# Patient Record
Sex: Female | Born: 1935 | Race: White | Hispanic: No | Smoking: Former smoker
Health system: Southern US, Community
[De-identification: ages and names within clinical notes are randomized; demographics above are authoritative.]

## PROBLEM LIST (undated history)

## (undated) DIAGNOSIS — M1990 Unspecified osteoarthritis, unspecified site: Secondary | ICD-10-CM

## (undated) DIAGNOSIS — E785 Hyperlipidemia, unspecified: Secondary | ICD-10-CM

## (undated) DIAGNOSIS — N390 Urinary tract infection, site not specified: Secondary | ICD-10-CM

## (undated) DIAGNOSIS — W19XXXA Unspecified fall, initial encounter: Secondary | ICD-10-CM

## (undated) DIAGNOSIS — E039 Hypothyroidism, unspecified: Secondary | ICD-10-CM

## (undated) DIAGNOSIS — F0390 Unspecified dementia without behavioral disturbance: Secondary | ICD-10-CM

## (undated) DIAGNOSIS — M4800 Spinal stenosis, site unspecified: Secondary | ICD-10-CM

## (undated) DIAGNOSIS — H409 Unspecified glaucoma: Secondary | ICD-10-CM

## (undated) DIAGNOSIS — M810 Age-related osteoporosis without current pathological fracture: Secondary | ICD-10-CM

---

## 2006-06-10 ENCOUNTER — Encounter: Payer: Self-pay | Admitting: Psychiatry

## 2006-06-25 ENCOUNTER — Encounter: Payer: Self-pay | Admitting: Psychiatry

## 2006-07-26 ENCOUNTER — Encounter: Payer: Self-pay | Admitting: Psychiatry

## 2006-08-18 ENCOUNTER — Ambulatory Visit: Payer: Self-pay | Admitting: Anesthesiology

## 2006-09-08 ENCOUNTER — Ambulatory Visit: Payer: Self-pay | Admitting: Anesthesiology

## 2006-10-15 ENCOUNTER — Ambulatory Visit: Payer: Self-pay | Admitting: Anesthesiology

## 2006-11-18 ENCOUNTER — Ambulatory Visit: Payer: Self-pay | Admitting: Anesthesiology

## 2007-01-19 ENCOUNTER — Ambulatory Visit: Payer: Self-pay | Admitting: Anesthesiology

## 2007-03-25 ENCOUNTER — Ambulatory Visit: Payer: Self-pay | Admitting: Anesthesiology

## 2007-04-15 ENCOUNTER — Ambulatory Visit: Payer: Self-pay | Admitting: Anesthesiology

## 2007-05-14 ENCOUNTER — Ambulatory Visit: Payer: Self-pay | Admitting: Anesthesiology

## 2007-10-06 ENCOUNTER — Ambulatory Visit: Payer: Self-pay | Admitting: Anesthesiology

## 2007-12-08 ENCOUNTER — Ambulatory Visit: Payer: Self-pay | Admitting: Anesthesiology

## 2008-02-08 ENCOUNTER — Ambulatory Visit: Payer: Self-pay | Admitting: Anesthesiology

## 2008-04-21 ENCOUNTER — Ambulatory Visit: Payer: Self-pay | Admitting: Anesthesiology

## 2008-09-05 ENCOUNTER — Ambulatory Visit: Payer: Self-pay | Admitting: Anesthesiology

## 2008-11-17 ENCOUNTER — Ambulatory Visit: Payer: Self-pay | Admitting: Anesthesiology

## 2011-03-19 ENCOUNTER — Ambulatory Visit: Payer: Self-pay | Admitting: Endocrinology

## 2011-10-30 ENCOUNTER — Observation Stay: Payer: Self-pay | Admitting: Specialist

## 2011-10-30 LAB — CBC WITH DIFFERENTIAL/PLATELET
Basophil %: 0.6 %
Eosinophil #: 0 10*3/uL (ref 0.0–0.7)
Eosinophil %: 0.2 %
HGB: 11 g/dL — ABNORMAL LOW (ref 12.0–16.0)
Lymphocyte #: 0.8 10*3/uL — ABNORMAL LOW (ref 1.0–3.6)
Lymphocyte %: 14.9 %
MCHC: 33.4 g/dL (ref 32.0–36.0)
MCV: 89 fL (ref 80–100)
Monocyte %: 7.1 %
Neutrophil #: 4 10*3/uL (ref 1.4–6.5)
Neutrophil %: 77.2 %
RBC: 3.69 10*6/uL — ABNORMAL LOW (ref 3.80–5.20)
RDW: 15.4 % — ABNORMAL HIGH (ref 11.5–14.5)
WBC: 5.2 10*3/uL (ref 3.6–11.0)

## 2011-10-30 LAB — BASIC METABOLIC PANEL
Anion Gap: 7 (ref 7–16)
Calcium, Total: 8.9 mg/dL (ref 8.5–10.1)
Co2: 26 mmol/L (ref 21–32)
EGFR (African American): 55 — ABNORMAL LOW
Glucose: 90 mg/dL (ref 65–99)
Osmolality: 286 (ref 275–301)
Sodium: 142 mmol/L (ref 136–145)

## 2011-10-30 LAB — TROPONIN I: Troponin-I: <0.02 ng/mL

## 2011-10-30 LAB — URINALYSIS, COMPLETE
Bacteria: NONE SEEN
Bilirubin,UR: NEGATIVE
Glucose,UR: NEGATIVE mg/dL (ref 0–75)
Hyaline Cast: 10
Nitrite: NEGATIVE
Protein: NEGATIVE
WBC UR: 35 /HPF (ref 0–5)

## 2011-10-30 LAB — PROTIME-INR
INR: 1
Prothrombin Time: 13.2 secs (ref 11.5–14.7)

## 2011-10-30 LAB — CK TOTAL AND CKMB (NOT AT ARMC)
CK, Total: 265 U/L — ABNORMAL HIGH (ref 21–215)
CK-MB: 7.9 ng/mL — ABNORMAL HIGH (ref 0.5–3.6)

## 2011-10-31 LAB — CBC WITH DIFFERENTIAL/PLATELET
Basophil #: 0 10*3/uL (ref 0.0–0.1)
Basophil %: 0.9 %
Eosinophil #: 0.1 10*3/uL (ref 0.0–0.7)
Eosinophil %: 1.3 %
HGB: 9.5 g/dL — ABNORMAL LOW (ref 12.0–16.0)
Lymphocyte #: 0.9 10*3/uL — ABNORMAL LOW (ref 1.0–3.6)
MCH: 30.1 pg (ref 26.0–34.0)
MCHC: 33.9 g/dL (ref 32.0–36.0)
MCV: 89 fL (ref 80–100)
Monocyte #: 0.3 x10 3/mm (ref 0.2–0.9)
Neutrophil #: 2.8 10*3/uL (ref 1.4–6.5)
Neutrophil %: 68.2 %
RBC: 3.17 10*6/uL — ABNORMAL LOW (ref 3.80–5.20)
WBC: 4.2 10*3/uL (ref 3.6–11.0)

## 2011-10-31 LAB — CK: CK, Total: 250 U/L — ABNORMAL HIGH (ref 21–215)

## 2011-10-31 LAB — BASIC METABOLIC PANEL
Anion Gap: 7 (ref 7–16)
Calcium, Total: 8.1 mg/dL — ABNORMAL LOW (ref 8.5–10.1)
Co2: 25 mmol/L (ref 21–32)
Creatinine: 0.81 mg/dL (ref 0.60–1.30)
EGFR (African American): >60
Osmolality: 283 (ref 275–301)
Potassium: 3.3 mmol/L — ABNORMAL LOW (ref 3.5–5.1)

## 2011-10-31 LAB — CK-MB: CK-MB: 6.6 ng/mL — ABNORMAL HIGH (ref 0.5–3.6)

## 2011-11-01 LAB — URINE CULTURE

## 2012-07-31 ENCOUNTER — Emergency Department: Payer: Self-pay | Admitting: Emergency Medicine

## 2012-08-03 ENCOUNTER — Emergency Department: Payer: Self-pay | Admitting: Emergency Medicine

## 2013-09-02 ENCOUNTER — Emergency Department: Payer: Self-pay | Admitting: Emergency Medicine

## 2013-09-02 LAB — CBC
HCT: 34 % — AB (ref 35.0–47.0)
HGB: 11.2 g/dL — ABNORMAL LOW (ref 12.0–16.0)
MCH: 30.9 pg (ref 26.0–34.0)
MCHC: 33.1 g/dL (ref 32.0–36.0)
MCV: 93 fL (ref 80–100)
Platelet: 222 10*3/uL (ref 150–440)
RBC: 3.64 10*6/uL — ABNORMAL LOW (ref 3.80–5.20)
RDW: 16.2 % — ABNORMAL HIGH (ref 11.5–14.5)
WBC: 5.9 10*3/uL (ref 3.6–11.0)

## 2013-09-02 LAB — COMPREHENSIVE METABOLIC PANEL WITH GFR
Albumin: 3.3 g/dL — ABNORMAL LOW
Alkaline Phosphatase: 95 U/L
Anion Gap: 7
BUN: 40 mg/dL — ABNORMAL HIGH
Bilirubin,Total: 0.4 mg/dL
Calcium, Total: 8.8 mg/dL
Chloride: 107 mmol/L
Co2: 25 mmol/L
Creatinine: 1.56 mg/dL — ABNORMAL HIGH
EGFR (African American): 33 — ABNORMAL LOW
EGFR (Non-African Amer.): 28 — ABNORMAL LOW
Glucose: 129 mg/dL — ABNORMAL HIGH
Osmolality: 289
Potassium: 4.5 mmol/L
SGOT(AST): 44 U/L — ABNORMAL HIGH
SGPT (ALT): 32 U/L
Sodium: 139 mmol/L
Total Protein: 6.9 g/dL

## 2013-09-02 LAB — TROPONIN I

## 2013-09-03 LAB — URINALYSIS, COMPLETE
Bilirubin,UR: NEGATIVE
Glucose,UR: NEGATIVE mg/dL (ref 0–75)
Ketone: NEGATIVE
Nitrite: POSITIVE
PH: 5 (ref 4.5–8.0)
Protein: NEGATIVE
Specific Gravity: 1.015 (ref 1.003–1.030)
Squamous Epithelial: NONE SEEN

## 2013-09-14 ENCOUNTER — Emergency Department: Payer: Self-pay | Admitting: Emergency Medicine

## 2013-09-14 LAB — BASIC METABOLIC PANEL
Anion Gap: 6 — ABNORMAL LOW (ref 7–16)
BUN: 30 mg/dL — ABNORMAL HIGH (ref 7–18)
Calcium, Total: 8.6 mg/dL (ref 8.5–10.1)
Chloride: 111 mmol/L — ABNORMAL HIGH (ref 98–107)
Co2: 25 mmol/L (ref 21–32)
Creatinine: 1.26 mg/dL (ref 0.60–1.30)
EGFR (Non-African Amer.): 36 — ABNORMAL LOW
GFR CALC AF AMER: 42 — AB
Glucose: 80 mg/dL (ref 65–99)
Osmolality: 288 (ref 275–301)
Potassium: 4.1 mmol/L (ref 3.5–5.1)
Sodium: 142 mmol/L (ref 136–145)

## 2013-09-14 LAB — URINALYSIS, COMPLETE
Bacteria: NONE SEEN
Bilirubin,UR: NEGATIVE
Blood: NEGATIVE
GLUCOSE, UR: NEGATIVE mg/dL (ref 0–75)
Ketone: NEGATIVE
Nitrite: NEGATIVE
Ph: 5 (ref 4.5–8.0)
Protein: NEGATIVE
RBC,UR: NONE SEEN /HPF (ref 0–5)
Specific Gravity: 1.014 (ref 1.003–1.030)
Squamous Epithelial: NONE SEEN
WBC UR: NONE SEEN /HPF (ref 0–5)

## 2013-09-14 LAB — CBC
HCT: 33.9 % — ABNORMAL LOW (ref 35.0–47.0)
HGB: 10.9 g/dL — ABNORMAL LOW (ref 12.0–16.0)
MCH: 30.3 pg (ref 26.0–34.0)
MCHC: 32.2 g/dL (ref 32.0–36.0)
MCV: 94 fL (ref 80–100)
Platelet: 253 10*3/uL (ref 150–440)
RBC: 3.6 10*6/uL — ABNORMAL LOW (ref 3.80–5.20)
RDW: 16 % — ABNORMAL HIGH (ref 11.5–14.5)
WBC: 5.1 10*3/uL (ref 3.6–11.0)

## 2014-02-23 ENCOUNTER — Emergency Department: Payer: Self-pay | Admitting: Emergency Medicine

## 2014-06-07 ENCOUNTER — Emergency Department
Admit: 2014-06-07 | Disposition: A | Discharge status: Other Acute Inpatient Hospital | Payer: Self-pay | Admitting: Emergency Medicine

## 2014-06-13 NOTE — Discharge Summary (Signed)
PATIENT NAME:  Carrie, Espinoza MR#:  [id number] DATE OF BIRTH:  14-Nov-1917  DATE OF ADMISSION:  10/30/2011 DATE OF DISCHARGE:  11/03/2011  For a detailed note, please take a look at the history and physical done on admission by Dr. Auburn Bilberry.    DIAGNOSES AT DISCHARGE:  1. Altered mental status/confusion secondary to urinary tract infection.  2. Status post mechanical fall.  3. Urinary tract infection.  4. Hypothyroidism.  5. Hypertension.  6. Hypokalemia.   DIET: The patient is being discharged on a low sodium, low fat diet.   ACTIVITY: As tolerated.   DISPOSITION: The patient currently is being discharged to assisted living facility at [facility]. Follow-up is with primary care physician there.   DISCHARGE MEDICATIONS:  1. Calcium with Vitamin D 1 tab daily.  2. Multivitamin daily.  3. Vitamin D 400 international units b.i.d.  4. Fosamax 70 mg weekly.  5. Synthroid 112 mcg daily.  6. Gabapentin 100 mg t.i.d.  7. Dorzolamide/timolol one drop to each eye b.i.d.   8. Celebrex 100 mg b.i.d.  9. Ramipril 5 mg daily.  10. Simvastatin 20 mg daily.  11. Aspirin 81 mg daily.  12. Colace 100 mg b.i.d.  13. Ceftin 250 mg b.i.d. x3 days.   DO NOT TAKE:  1. Skelaxin. 2. Timolol.   PERTINENT STUDIES DONE DURING THE HOSPITAL COURSE:  1. CT of the head done without contrast on admission showing no evidence of any acute ischemic or hemorrhagic infarction.  2. Chest x-ray done on admission showing no acute cardiopulmonary disease.  3. X-ray of the right shoulder showing no acute bony abnormality.  4. X-ray of the right humerus showing severe osteopenia and degenerative changes but no evidence of fracture-dislocation or foreign body.  5. X-ray of the right elbow also showing no evidence of any fracture-dislocation but demonstrates osteopenia.   HOSPITAL COURSE: This is a 81 year old female with medical problems as mentioned above who presented to the hospital after a mechanical  fall and noted to have altered mental status.  1. Mental status. The patient does have some baseline underlying dementia. Although her mental status was worse, she was noted to have a urinary tract infection. After being treated with IV antibiotics, the patient's mental status has come back to baseline. She will finish treatment for her urinary tract infection as stated. As mentioned, a CT of the head showed no acute abnormalities.  2. Status post mechanical fall. The patient underwent numerous imaging studies showing no evidence of any bony abnormality or fracture. She was noted to have a significant bruise on the right upper extremity. She was evaluated by physical therapy, did not require any services but is not safe to live at home by herself. Therefore, she is currently being discharged to assisted living as per family's request.  3. Hypokalemia. The patient was placed on potassium supplements and this has since then improved and resolved.  4. Hypertension. The patient was maintained on her ramipril and she remained hemodynamically stable.  5. Hypothyroidism. The patient's TSH was significantly elevated at 56. As per the family, patient is not compliant with her meds. She will continue her Synthroid dose as stated and should have a TSH checked within the next 3 to 6 months.   CODE STATUS: The patient is a FULL CODE.     TIME SPENT: 40 minutes.   ____________________________ Rolly Pancake. Cherlynn Kaiser, MD vjs:drc D: 11/03/2011 15:25:59 ET T: 11/04/2011 13:12:56 ET JOB#: [id number]  cc: Rolly Pancake. Cherlynn Kaiser, MD, <  Dictator> Houston SirenVIVEK J Ram Haugan MD ELECTRONICALLY SIGNED 11/08/2011 13:40

## 2014-06-13 NOTE — H&P (Signed)
PATIENT NAME:  Carrie Espinoza, Carrie Espinoza MR#:  [id number] DATE OF BIRTH:  Jul 13, 1917  DATE OF ADMISSION:  10/30/2011  PRIMARY CARE PHYSICIAN: Dr. Patrecia Pace  ED REFERRING PHYSICIAN: Dr. Carollee Massed   CHIEF COMPLAINT: Sent from primary MD for evaluation for patient has had a fall.   HISTORY OF PRESENT ILLNESS: Patient is a 81 year old Caucasian female who has apparently diagnosis of likely dementia who lives alone who has fallen a few times, has fallen recently. She is not able to tell me when. Has a large bruise on her right upper extremity. She was seen by her primary care provider, Dr. Patrecia Pace, who referred her to ED for further evaluation. Patient's nephew spoke to the ED doctor and has reported that she has not been doing too well at home alone and has had progressive confusion and her dementia has progressed. Patient at this point is not able to give me any further history.   PAST MEDICAL HISTORY: Based on previous documentations has a history of:  1. Degenerative disk disease. 2. Spinal stenosis. 3. Osteoarthritis. 4. Shingles. 5. Osteoporosis. 6. Hypertension. 7. Hypothyroidism.   PAST SURGICAL HISTORY: Unknown.   ALLERGIES: Codeine.   MEDICATIONS: List is currently not available. We are attempting to obtain the list.   SOCIAL HISTORY: Does not smoke. No history of drinking. No drugs. Lives alone.   FAMILY HISTORY: Unavailable.   REVIEW OF SYSTEMS: Unobtainable due to patient being confused.   PHYSICAL EXAMINATION:  VITAL SIGNS: Temperature 96.5, pulse 75, respirations 18, blood pressure 157/77.   GENERAL: Patient is an elderly-appearing female currently not in acute distress.   HEENT: Head atraumatic, normocephalic. Pupils equal, round, reactive to light and accommodation. There is no conjunctival pallor. No scleral icterus. Nasal exam shows no drainage or ulceration. Oropharynx is clear without any exudate.   NECK: No thyromegaly. No carotid bruits.   CARDIOVASCULAR: Regular rate  and rhythm. No murmurs, rubs, clicks, or gallops. PMI is not displaced.   LUNGS: Clear to auscultation bilaterally without any rales, rhonchi, wheezing.   ABDOMEN: Soft, nontender, nondistended. Positive bowel sounds x4.   EXTREMITIES: No clubbing, cyanosis. He has 2+ edema.    SKIN: No rash. Has a large bruise involving her right forearm and arm.  LYMPHATICS: No lymph nodes palpable.   VASCULAR: Good DP, PT pulses.  NEUROLOGICAL: Cranial nerves II through XII grossly intact. No focal deficits.   PSYCHIATRIC: Patient is awake but not oriented to place or time, oriented to person, not anxious or depressed.   LABORATORY, DIAGNOSTIC AND RADIOLOGICAL DATA: CT scan of the head shows no evidence of acute ischemic hemorrhagic infarct. No evidence of acute fracture. Glucose 90, BUN 21, creatinine 1.03, sodium 142, potassium 3.6, chloride 109, CO2 26, CPK 265. CK-MB 7.9. WBC 5.2, hemoglobin 11, platelet count 264. INR 1.0. Troponin less than 0.02. Urinalysis 2+ leukocytes, WBCs 35.   ASSESSMENT AND PLAN: 81 year old white female who has fallen, has a large bruise, there is more confusion.  1. Altered mental status likely due to progressive dementia, also could have been exacerbated by possible urinary tract infection. At this time will get urine cultures. Place her on IV ceftriaxone, give 500 mL of fluid in light of her edema in the lower extremity.  2. Urinary tract infection. IV antibiotics with ceftriaxone. Will get urine cultures.  3. Dementia, likely progressive.  4. Falls. Will get PT to evaluate.  5. History of possible hypothyroidism. Will check a TSH. Obtain her home medication, resume as previously.  6. Hypertension.  Again, will place her on p.r.n. hydralazine for time being. Resume her home medication once available.  7. Disposition. Lives alone. Will get case manager evaluation. May need placement to assisted living or skilled nursing facility.   TIME SPENT: 35 minutes.    ____________________________ Lacie ScottsShreyang H. Allena KatzPatel, MD shp:cms D: 10/30/2011 18:09:43 ET T: 10/31/2011 06:23:31 ET JOB#: [id number]  cc: Marelly Wehrman H. Allena KatzPatel, MD, <Dictator> Alan MulderShamil J. Morayati, MD  Charise CarwinSHREYANG H Jasten Guyette MD ELECTRONICALLY SIGNED 10/31/2011 18:11

## 2014-08-09 ENCOUNTER — Emergency Department
Admission: EM | Admit: 2014-08-09 | Discharge: 2014-08-10 | Disposition: A | Discharge status: Home / Self Care | Payer: Medicare Other | Attending: Emergency Medicine | Admitting: Emergency Medicine

## 2014-08-09 DIAGNOSIS — Z043 Encounter for examination and observation following other accident: Secondary | ICD-10-CM | POA: Diagnosis present

## 2014-08-09 DIAGNOSIS — W19XXXA Unspecified fall, initial encounter: Secondary | ICD-10-CM

## 2014-08-09 DIAGNOSIS — Y9389 Activity, other specified: Secondary | ICD-10-CM | POA: Insufficient documentation

## 2014-08-09 DIAGNOSIS — Y92129 Unspecified place in nursing home as the place of occurrence of the external cause: Secondary | ICD-10-CM | POA: Insufficient documentation

## 2014-08-09 DIAGNOSIS — Y998 Other external cause status: Secondary | ICD-10-CM | POA: Insufficient documentation

## 2014-08-09 DIAGNOSIS — W1839XA Other fall on same level, initial encounter: Secondary | ICD-10-CM | POA: Diagnosis not present

## 2014-08-09 DIAGNOSIS — F0390 Unspecified dementia without behavioral disturbance: Secondary | ICD-10-CM | POA: Insufficient documentation

## 2014-08-09 DIAGNOSIS — Z139 Encounter for screening, unspecified: Secondary | ICD-10-CM

## 2014-08-09 DIAGNOSIS — Z0389 Encounter for observation for other suspected diseases and conditions ruled out: Secondary | ICD-10-CM | POA: Diagnosis not present

## 2014-08-09 HISTORY — DX: Hypothyroidism, unspecified: E03.9

## 2014-08-09 HISTORY — DX: Unspecified dementia, unspecified severity, without behavioral disturbance, psychotic disturbance, mood disturbance, and anxiety: F03.90

## 2014-08-09 HISTORY — DX: Unspecified osteoarthritis, unspecified site: M19.90

## 2014-08-09 HISTORY — DX: Unspecified glaucoma: H40.9

## 2014-08-09 HISTORY — DX: Spinal stenosis, site unspecified: M48.00

## 2014-08-09 HISTORY — DX: Urinary tract infection, site not specified: N39.0

## 2014-08-09 HISTORY — DX: Age-related osteoporosis without current pathological fracture: M81.0

## 2014-08-09 HISTORY — DX: Unspecified fall, initial encounter: W19.XXXA

## 2014-08-09 HISTORY — DX: Hyperlipidemia, unspecified: E78.5

## 2014-08-09 NOTE — ED Notes (Signed)
Pt attempting to get out of bed again, pt placed at nursing station for closer observation.

## 2014-08-09 NOTE — ED Notes (Signed)
Pt out of bed and standing in the room, pt assisted to restroom, incontinent of black stool. Unable to obtain urine sample d/t contamination by feces. Pt cleansed, yellow socks and posey alarm placed on bed.

## 2014-08-09 NOTE — ED Notes (Signed)
Pt presents to ED via EMS for witnessed fall. Pt fell backward. No obvious injury noted and pt denies pain. Old bruises noted. Hx of dementia.

## 2014-08-09 NOTE — ED Provider Notes (Signed)
[facility] Emergency Department Provider Note  ____________________________________________  Time seen: 11:15 PM  I have reviewed the triage vital signs and the nursing notes.   HISTORY  Chief Complaint Fall      HPI Carrie Espinoza is a 81 y.o. female presenting nursing home facility with history of a witnessed fall. Patient is pleasantly demented as such history obtained from EMS.She was no complaints    Past medical history Dementia There are no active problems to display for this patient.   No past surgical history on file.  No current outpatient prescriptions on file.  Allergies Review of patient's allergies indicates not on file.  No family history on file.  Social History History  Substance Use Topics  . Smoking status: Not on file  . Smokeless tobacco: Not on file  . Alcohol Use: Not on file    Review of Systems  Constitutional: Negative for fever. Eyes: Negative for visual changes. ENT: Negative for sore throat. Cardiovascular: Negative for chest pain. Respiratory: Negative for shortness of breath. Gastrointestinal: Negative for abdominal pain, vomiting and diarrhea. Genitourinary: Negative for dysuria. Musculoskeletal: Negative for back pain. Skin: Negative for rash. Neurological: Negative for headaches, focal weakness or numbness.  10-point ROS otherwise negative.  ____________________________________________   PHYSICAL EXAM:  VITAL SIGNS: ED Triage Vitals  Enc Vitals Group     BP 08/09/14 2153 142/70 mmHg     Pulse Rate 08/09/14 2153 58     Resp 08/09/14 2230 14     Temp 08/09/14 2153 97.4 F (36.3 C)     Temp Source 08/09/14 2153 Oral     SpO2 08/09/14 2153 98 %     Weight --      Height --      Head Cir --      Peak Flow --      Pain Score 08/09/14 2154 0     Pain Loc --      Pain Edu? --      Excl. in GC? --      Constitutional: Alert and oriented. Well appearing and in no distress. Eyes:  Conjunctivae are normal. PERRL. Normal extraocular movements. ENT   Head: Normocephalic and atraumatic.   Nose: No congestion/rhinnorhea.   Mouth/Throat: Mucous membranes are moist.   Neck: No stridor. Hematological/Lymphatic/Immunilogical: No cervical lymphadenopathy. Cardiovascular: Normal rate, regular rhythm. Normal and symmetric distal pulses are present in all extremities. No murmurs, rubs, or gallops. Respiratory: Normal respiratory effort without tachypnea nor retractions. Breath sounds are clear and equal bilaterally. No wheezes/rales/rhonchi. Gastrointestinal: Soft and nontender. No distention. There is no CVA tenderness. Genitourinary: deferred Musculoskeletal: Nontender with normal range of motion in all extremities. No joint effusions.  No lower extremity tenderness nor edema. Neurologic:  Normal speech and language. No gross focal neurologic deficits are appreciated. Speech is normal.  Skin:  Skin is warm, dry and intact. No rash noted.  ____________________________________________         INITIAL IMPRESSION / ASSESSMENT AND PLAN / ED COURSE  Pertinent labs & imaging results that were available during my care of the patient were reviewed by me and considered in my medical decision making (see chart for details).  no injury noted on exam   ____________________________________________   FINAL CLINICAL IMPRESSION(S) / ED DIAGNOSES  Final diagnoses:  Encounter for medical screening examination  Fall, initial encounter      Darci Current, MD 08/10/14 [phone]

## 2014-08-10 ENCOUNTER — Encounter: Payer: Self-pay | Admitting: Emergency Medicine

## 2014-08-10 NOTE — Discharge Instructions (Signed)

## 2014-10-08 ENCOUNTER — Emergency Department
Admission: EM | Admit: 2014-10-08 | Discharge: 2014-10-08 | Disposition: A | Discharge status: Home / Self Care | Payer: Medicare Other | Attending: Emergency Medicine | Admitting: Emergency Medicine

## 2014-10-08 ENCOUNTER — Emergency Department: Payer: Medicare Other

## 2014-10-08 ENCOUNTER — Encounter: Payer: Self-pay | Admitting: Emergency Medicine

## 2014-10-08 DIAGNOSIS — Y9289 Other specified places as the place of occurrence of the external cause: Secondary | ICD-10-CM | POA: Diagnosis not present

## 2014-10-08 DIAGNOSIS — S0181XA Laceration without foreign body of other part of head, initial encounter: Secondary | ICD-10-CM | POA: Diagnosis not present

## 2014-10-08 DIAGNOSIS — S0083XA Contusion of other part of head, initial encounter: Secondary | ICD-10-CM

## 2014-10-08 DIAGNOSIS — W010XXA Fall on same level from slipping, tripping and stumbling without subsequent striking against object, initial encounter: Secondary | ICD-10-CM | POA: Diagnosis not present

## 2014-10-08 DIAGNOSIS — Z87891 Personal history of nicotine dependence: Secondary | ICD-10-CM | POA: Insufficient documentation

## 2014-10-08 DIAGNOSIS — IMO0002 Reserved for concepts with insufficient information to code with codable children: Secondary | ICD-10-CM

## 2014-10-08 DIAGNOSIS — Y998 Other external cause status: Secondary | ICD-10-CM | POA: Diagnosis not present

## 2014-10-08 DIAGNOSIS — F0390 Unspecified dementia without behavioral disturbance: Secondary | ICD-10-CM | POA: Diagnosis not present

## 2014-10-08 DIAGNOSIS — W19XXXA Unspecified fall, initial encounter: Secondary | ICD-10-CM

## 2014-10-08 DIAGNOSIS — R011 Cardiac murmur, unspecified: Secondary | ICD-10-CM | POA: Diagnosis not present

## 2014-10-08 DIAGNOSIS — Y9389 Activity, other specified: Secondary | ICD-10-CM | POA: Diagnosis not present

## 2014-10-08 DIAGNOSIS — S29001A Unspecified injury of muscle and tendon of front wall of thorax, initial encounter: Secondary | ICD-10-CM | POA: Diagnosis not present

## 2014-10-08 DIAGNOSIS — S0993XA Unspecified injury of face, initial encounter: Secondary | ICD-10-CM | POA: Diagnosis present

## 2014-10-08 MED ORDER — OXYCODONE-ACETAMINOPHEN 5-325 MG PO TABS
2.0000 | ORAL_TABLET | Freq: Once | ORAL | Status: AC
  Administered 2014-10-08: 2 via ORAL

## 2014-10-08 MED ORDER — OXYCODONE-ACETAMINOPHEN 5-325 MG PO TABS
ORAL_TABLET | ORAL | Status: AC
  Administered 2014-10-08: 2 via ORAL
  Filled 2014-10-08: qty 2

## 2014-10-08 NOTE — Discharge Instructions (Signed)
Contusion A contusion is a deep bruise. Contusions are the result of an injury that caused bleeding under the skin. The contusion may turn blue, purple, or yellow. Minor injuries will give you a painless contusion, but more severe contusions may stay painful and swollen for a few weeks.  CAUSES  A contusion is usually caused by a blow, trauma, or direct force to an area of the body. SYMPTOMS   Swelling and redness of the injured area.  Bruising of the injured area.  Tenderness and soreness of the injured area.  Pain. DIAGNOSIS  The diagnosis can be made by taking a history and physical exam. An X-ray, CT scan, or MRI may be needed to determine if there were any associated injuries, such as fractures. TREATMENT  Specific treatment will depend on what area of the body was injured. In general, the best treatment for a contusion is resting, icing, elevating, and applying cold compresses to the injured area. Over-the-counter medicines may also be recommended for pain control. Ask your caregiver what the best treatment is for your contusion. HOME CARE INSTRUCTIONS   Put ice on the injured area.  Put ice in a plastic bag.  Place a towel between your skin and the bag.  Leave the ice on for 15-20 minutes, 3-4 times a day, or as directed by your health care provider.  Only take over-the-counter or prescription medicines for pain, discomfort, or fever as directed by your caregiver. Your caregiver may recommend avoiding anti-inflammatory medicines (aspirin, ibuprofen, and naproxen) for 48 hours because these medicines may increase bruising.  Rest the injured area.  If possible, elevate the injured area to reduce swelling. SEEK IMMEDIATE MEDICAL CARE IF:   You have increased bruising or swelling.  You have pain that is getting worse.  Your swelling or pain is not relieved with medicines. MAKE SURE YOU:   Understand these instructions.  Will watch your condition.  Will get help right  away if you are not doing well or get worse. Document Released: 11/20/2004 Document Revised: 02/15/2013 Document Reviewed: 12/16/2010 [facility] Patient Information 2015 [place], [place]. This information is not intended to replace advice given to you by your health care provider. Make sure you discuss any questions you have with your health care provider.  Laceration Care, Adult A laceration is a cut or lesion that goes through all layers of the skin and into the tissue just beneath the skin. TREATMENT  Some lacerations may not require closure. Some lacerations may not be able to be closed due to an increased risk of infection. It is important to see your caregiver as soon as possible after an injury to minimize the risk of infection and maximize the opportunity for successful closure. If closure is appropriate, pain medicines may be given, if needed. The wound will be cleaned to help prevent infection. Your caregiver will use stitches (sutures), staples, wound glue (adhesive), or skin adhesive strips to repair the laceration. These tools bring the skin edges together to allow for faster healing and a better cosmetic outcome. However, all wounds will heal with a scar. Once the wound has healed, scarring can be minimized by covering the wound with sunscreen during the day for 1 full year. HOME CARE INSTRUCTIONS  For sutures or staples:  Keep the wound clean and dry.  If you were given a bandage (dressing), you should change it at least once a day. Also, change the dressing if it becomes wet or dirty, or as directed by your caregiver.  Wash the wound with soap and water 2 times a day. Rinse the wound off with water to remove all soap. Pat the wound dry with a clean towel. °· After cleaning, apply a thin layer of the antibiotic ointment as recommended by your caregiver. This will help prevent infection and keep the dressing from sticking. °· You may shower as usual after the first 24 hours. Do not soak  the wound in water until the sutures are removed. °· Only take over-the-counter or prescription medicines for pain, discomfort, or fever as directed by your caregiver. °· Get your sutures or staples removed as directed by your caregiver. °For skin adhesive strips: °· Keep the wound clean and dry. °· Do not get the skin adhesive strips wet. You may bathe carefully, using caution to keep the wound dry. °· If the wound gets wet, pat it dry with a clean towel. °· Skin adhesive strips will fall off on their own. You may trim the strips as the wound heals. Do not remove skin adhesive strips that are still stuck to the wound. They will fall off in time. °For wound adhesive: °· You may briefly wet your wound in the shower or bath. Do not soak or scrub the wound. Do not swim. Avoid periods of heavy perspiration until the skin adhesive has fallen off on its own. After showering or bathing, gently pat the wound dry with a clean towel. °· Do not apply liquid medicine, cream medicine, or ointment medicine to your wound while the skin adhesive is in place. This may loosen the film before your wound is healed. °· If a dressing is placed over the wound, be careful not to apply tape directly over the skin adhesive. This may cause the adhesive to be pulled off before the wound is healed. °· Avoid prolonged exposure to sunlight or tanning lamps while the skin adhesive is in place. Exposure to ultraviolet light in the first year will darken the scar. °· The skin adhesive will usually remain in place for 5 to 10 days, then naturally fall off the skin. Do not pick at the adhesive film. °You may need a tetanus shot if: °· You cannot remember when you had your last tetanus shot. °· You have never had a tetanus shot. °If you get a tetanus shot, your arm may swell, get red, and feel warm to the touch. This is common and not a problem. If you need a tetanus shot and you choose not to have one, there is a rare chance of getting tetanus.  Sickness from tetanus can be serious. °SEEK MEDICAL CARE IF:  °· You have redness, swelling, or increasing pain in the wound. °· You see a red line that goes away from the wound. °· You have yellowish-white fluid (pus) coming from the wound. °· You have a fever. °· You notice a bad smell coming from the wound or dressing. °· Your wound breaks open before or after sutures have been removed. °· You notice something coming out of the wound such as wood or glass. °· Your wound is on your hand or foot and you cannot move a finger or toe. °SEEK IMMEDIATE MEDICAL CARE IF:  °· Your pain is not controlled with prescribed medicine. °· You have severe swelling around the wound causing pain and numbness or a change in color in your arm, hand, leg, or foot. °· Your wound splits open and starts bleeding. °· You have worsening numbness, weakness, or loss of function of any   joint around or beyond the wound.  You develop painful lumps near the wound or on the skin anywhere on your body. MAKE SURE YOU:   Understand these instructions.  Will watch your condition.  Will get help right away if you are not doing well or get worse. Document Released: 02/10/2005 Document Revised: 05/05/2011 Document Reviewed: 08/06/2010 [facility] Patient Information 2015 [place], [place]. This information is not intended to replace advice given to you by your health care provider. Make sure you discuss any questions you have with your health care provider. Head Injury You have received a head injury. It does not appear serious at this time. Headaches and vomiting are common following head injury. It should be easy to awaken from sleeping. Sometimes it is necessary for you to stay in the emergency department for a while for observation. Sometimes admission to the hospital may be needed. After injuries such as yours, most problems occur within the first 24 hours, but side effects may occur up to 7-10 days after the injury. It is important for you  to carefully monitor your condition and contact your health care provider or seek immediate medical care if there is a change in your condition. WHAT ARE THE TYPES OF HEAD INJURIES? Head injuries can be as minor as a bump. Some head injuries can be more severe. More severe head injuries include:  A jarring injury to the brain (concussion).  A bruise of the brain (contusion). This mean there is bleeding in the brain that can cause swelling.  A cracked skull (skull fracture).  Bleeding in the brain that collects, clots, and forms a bump (hematoma). WHAT CAUSES A HEAD INJURY? A serious head injury is most likely to happen to someone who is in a car wreck and is not wearing a seat belt. Other causes of major head injuries include bicycle or motorcycle accidents, sports injuries, and falls. HOW ARE HEAD INJURIES DIAGNOSED? A complete history of the event leading to the injury and your current symptoms will be helpful in diagnosing head injuries. Many times, pictures of the brain, such as CT or MRI are needed to see the extent of the injury. Often, an overnight hospital stay is necessary for observation.  WHEN SHOULD I SEEK IMMEDIATE MEDICAL CARE?  You should get help right away if:  You have confusion or drowsiness.  You feel sick to your stomach (nauseous) or have continued, forceful vomiting.  You have dizziness or unsteadiness that is getting worse.  You have severe, continued headaches not relieved by medicine. Only take over-the-counter or prescription medicines for pain, fever, or discomfort as directed by your health care provider.  You do not have normal function of the arms or legs or are unable to walk.  You notice changes in the black spots in the center of the colored part of your eye (pupil).  You have a clear or bloody fluid coming from your nose or ears.  You have a loss of vision. During the next 24 hours after the injury, you must stay with someone who can watch you for  the warning signs. This person should contact local emergency services (911 in the [place] if you have seizures, you become unconscious, or you are unable to wake up. HOW CAN I PREVENT A HEAD INJURY IN THE FUTURE? The most important factor for preventing major head injuries is avoiding motor vehicle accidents. To minimize the potential for damage to your head, it is crucial to wear seat belts while riding in motor  vehicles. Wearing helmets while bike riding and playing collision sports (like football) is also helpful. Also, avoiding dangerous activities around the house will further help reduce your risk of head injury.  WHEN CAN I RETURN TO NORMAL ACTIVITIES AND ATHLETICS? You should be reevaluated by your health care provider before returning to these activities. If you have any of the following symptoms, you should not return to activities or contact sports until 1 week after the symptoms have stopped:  Persistent headache.  Dizziness or vertigo.  Poor attention and concentration.  Confusion.  Memory problems.  Nausea or vomiting.  Fatigue or tire easily.  Irritability.  Intolerant of bright lights or loud noises.  Anxiety or depression.  Disturbed sleep. MAKE SURE YOU:   Understand these instructions.  Will watch your condition.  Will get help right away if you are not doing well or get worse. Document Released: 02/10/2005 Document Revised: 02/15/2013 Document Reviewed: 10/18/2012 [facility] Patient Information 2015 [place], [place]. This information is not intended to replace advice given to you by your health care provider. Make sure you discuss any questions you have with your health care provider.

## 2014-10-08 NOTE — ED Notes (Signed)
Patient is a resident of [facility] Memory Care Unit. Patient is demented at baseline. Patient was found down, down for an unknown period. Patient arrives with injuries to Lower Left chin, Lower Left lip, and pain on palpation to the right ribs.

## 2014-10-08 NOTE — ED Notes (Signed)
Patient transported to CT 

## 2014-10-08 NOTE — ED Notes (Signed)
Report given to Susan 

## 2014-10-08 NOTE — ED Notes (Addendum)
Pt discharged with ambulance to home

## 2014-10-08 NOTE — ED Notes (Signed)
Family was going to take pt home but pt yelling out as we were trying to get her up. Dr came in and reevaluated - pt having left rib pain. Medicated and will be sent by ambulance

## 2014-10-08 NOTE — ED Notes (Signed)
Pt here for fall at SNF.  Pt has blood to left corner of lip.  Has laceration under chin.  Has some tenderness on right and left chest wall from fall.  Pt does not vocalize location of pain but does state that she hurts.  Pt has multiple bruises scattered over body.  Refer to physical diagram for additional information.

## 2014-10-08 NOTE — ED Provider Notes (Signed)
[facility] Emergency Department Provider Note     Time seen: ----------------------------------------- 9:15 AM on 10/08/2014 -----------------------------------------  Level V caveat: Review of systems and history cannot be obtained. Patient with a history of dementia  I have reviewed the triage vital signs and the nursing notes.   HISTORY  Chief Complaint No chief complaint on file.    HPI Carrie Espinoza is a 81 y.o. female who presents ER after being found on the ground at her nursing home this morning. She was found on the floor on EMS arrival after an unwitnessed fall. Noted to have facial injuries, patient cannot give review of systems or report.    Past Medical History  Diagnosis Date  . Fall with no significant injury   . Hypothyroid   . UTI (lower urinary tract infection)   . Osteoarthritis   . Osteoporosis   . Dementia   . Spinal stenosis   . Glaucoma   . Hyperlipemia     There are no active problems to display for this patient.   No past surgical history on file.  Allergies Review of patient's allergies indicates no known allergies.  Social History Social History  Substance Use Topics  . Smoking status: Former Games developer  . Smokeless tobacco: Not on file  . Alcohol Use: No    Review of Systems Unknown. According to reports she was complaining of some right side pain that she denies currently. Obvious facial injuries  ____________________________________________   PHYSICAL EXAM:  VITAL SIGNS: ED Triage Vitals  Enc Vitals Group     BP --      Pulse --      Resp --      Temp --      Temp src --      SpO2 --      Weight --      Height --      Head Cir --      Peak Flow --      Pain Score --      Pain Loc --      Pain Edu? --      Excl. in GC? --     Constitutional: Alert but disoriented. Well appearing and in no distress. Eyes: Conjunctivae are normal. PERRL. Normal extraocular movements. ENT   Head:  Normocephalic and atraumatic.   Nose: No congestion/rhinnorhea.   Mouth/Throat: Mucous membranes are moist, no intraoral lacerations are appreciated. The left side of the lower lip is contused, no appreciable laceration. Contusion or abrasion is noted to the right side of the upper lip   Neck: No stridor. Just below the chin and she has a curvilinear laceration that is superficial. Cardiovascular: Normal rate, regular rhythm. Normal and symmetric distal pulses are present in all extremities. 2/6 systolic murmur Respiratory: Normal respiratory effort without tachypnea nor retractions. Breath sounds are clear and equal bilaterally. No wheezes/rales/rhonchi. Gastrointestinal: Soft and nontender.  Musculoskeletal: Limited range of motion of her extremities. No lower extremity tenderness nor edema. Neurologic:  Patient is demented, cannot give report or history. Does not follow commands well Skin: Facial contusions, abrasions, lacerations ____________________________________________  ED COURSE:  Pertinent labs & imaging results that were available during my care of the patient were reviewed by me and considered in my medical decision making (see chart for details). Patient will need imaging to assess for facial and head trauma. ____________________________________________   RADIOLOGY Images were viewed by me  CT head, maxillofacial, chest x-ray, pelvis x-ray IMPRESSION: 1.  No radiographic evidence of significant acute traumatic injury to the thorax. 2. Mild diffuse interstitial prominence and peribronchial cuffing, slightly increased compared to prior studies, which may suggest an acute bronchitis. 3. Chronically elevated right hemidiaphragm is unchanged. 4. Atherosclerosis.  IMPRESSION: 1. No evidence of significant acute traumatic injury to the skull, brain or facial bones. 2. Moderate cerebral and mild cerebellar atrophy with extensive chronic microvascular ischemic changes  in the cerebral white matter.  IMPRESSION: No acute abnormality seen in the pelvis.  LACERATION REPAIR Performed by: Emily Filbert Authorized by: Daryel November E Consent: Verbal consent obtained. Risks and benefits: risks, benefits and alternatives were discussed Consent given by: patient Patient identity confirmed: provided demographic data Prepped and Draped in normal sterile fashion Wound explored  Laceration Location: Chin  Laceration Length: 2 cm  No Foreign Bodies seen or palpated   Amount of cleaning: standard  Skin closure: Dermabond   Patient tolerance: Patient tolerated the procedure well with no immediate complications. ____________________________________________  FINAL ASSESSMENT AND PLAN  Fall, facial contusion and 2 cm chin laceration, minor head injury  Plan: Patient with labs and imaging as dictated above. Patient is no acute distress, unclear etiology for fall. Stable to return to the nursing home.   Emily Filbert, MD   Emily Filbert, MD 10/08/14 [phone]

## 2014-11-12 ENCOUNTER — Emergency Department: Payer: Medicare Other

## 2014-11-12 ENCOUNTER — Emergency Department
Admission: EM | Admit: 2014-11-12 | Discharge: 2014-11-12 | Disposition: A | Discharge status: Home / Self Care | Payer: Medicare Other | Attending: Emergency Medicine | Admitting: Emergency Medicine

## 2014-11-12 DIAGNOSIS — Z79899 Other long term (current) drug therapy: Secondary | ICD-10-CM | POA: Diagnosis not present

## 2014-11-12 DIAGNOSIS — W1839XA Other fall on same level, initial encounter: Secondary | ICD-10-CM | POA: Insufficient documentation

## 2014-11-12 DIAGNOSIS — Y9289 Other specified places as the place of occurrence of the external cause: Secondary | ICD-10-CM | POA: Insufficient documentation

## 2014-11-12 DIAGNOSIS — S01511A Laceration without foreign body of lip, initial encounter: Secondary | ICD-10-CM | POA: Insufficient documentation

## 2014-11-12 DIAGNOSIS — Y998 Other external cause status: Secondary | ICD-10-CM | POA: Diagnosis not present

## 2014-11-12 DIAGNOSIS — R51 Headache: Secondary | ICD-10-CM | POA: Insufficient documentation

## 2014-11-12 DIAGNOSIS — IMO0002 Reserved for concepts with insufficient information to code with codable children: Secondary | ICD-10-CM

## 2014-11-12 DIAGNOSIS — Y9389 Activity, other specified: Secondary | ICD-10-CM | POA: Insufficient documentation

## 2014-11-12 DIAGNOSIS — Z7982 Long term (current) use of aspirin: Secondary | ICD-10-CM | POA: Insufficient documentation

## 2014-11-12 DIAGNOSIS — Z87891 Personal history of nicotine dependence: Secondary | ICD-10-CM | POA: Diagnosis not present

## 2014-11-12 DIAGNOSIS — W19XXXA Unspecified fall, initial encounter: Secondary | ICD-10-CM

## 2014-11-12 NOTE — ED Notes (Signed)
Patient to CT.

## 2014-11-12 NOTE — ED Notes (Signed)
Patient fell in dayroom at [facility] today.

## 2014-11-12 NOTE — ED Notes (Signed)
Discharge report called to [facility]. EMS called for transport to same.

## 2014-11-12 NOTE — ED Provider Notes (Signed)
[facility] Emergency Department Provider Note   ____________________________________________  Time seen: On EMS arrival  I have reviewed the triage vital signs and the nursing notes.   HISTORY  Chief Complaint Fall   History limited by: Dementia    HPI Carrie Espinoza is a 82 y.o. female presents to the emergency department from [facility] after a ground-level fall. This was witnessed. She stumbled and fell onto the ground hitting the front of her face. She did not have any loss of consciousness. She did chip one of her front teeth. Nursing staff stated that they saw the piece of the tooth on the tongue however do not know what happened to it. Per nursing home paperwork patient is not on any blood thinners.Patient herself denies any pain currently.     Past Medical History  Diagnosis Date  . Fall with no significant injury   . Hypothyroid   . UTI (lower urinary tract infection)   . Osteoarthritis   . Osteoporosis   . Dementia   . Spinal stenosis   . Glaucoma   . Hyperlipemia     There are no active problems to display for this patient.   No past surgical history on file.  Current Outpatient Rx  Name  Route  Sig  Dispense  Refill  . acetaminophen (TYLENOL) 500 MG tablet   Oral   Take 500 mg by mouth every 6 (six) hours as needed.         Marland Kitchen alendronate (FOSAMAX) 70 MG tablet   Oral   Take 70 mg by mouth once a week. Take with a full glass of water on an empty stomach.         Marland Kitchen alum & mag hydroxide-simeth (MAALOX PLUS) 400-400-40 MG/5ML suspension   Oral   Take by mouth every 6 (six) hours as needed for indigestion.         Marland Kitchen amLODipine (NORVASC) 10 MG tablet   Oral   Take 10 mg by mouth daily.         Marland Kitchen aspirin 81 MG tablet   Oral   Take 81 mg by mouth daily.         . Calcium Carbonate-Vitamin D (CALTRATE 600+D PO)   Oral   Take by mouth.         . divalproex (DEPAKOTE SPRINKLE) 125 MG capsule   Oral  Take by mouth 2 (two) times daily.         Marland Kitchen docusate (COLACE) 50 MG/5ML liquid   Oral   Take by mouth daily.         . dorzolamide-timolol (COSOPT) 22.3-6.8 MG/ML ophthalmic solution      1 drop 2 (two) times daily.         . ferrous sulfate 325 (65 FE) MG tablet   Oral   Take 325 mg by mouth daily with breakfast.         . Gauze Pads & Dressings (TELFA ADHESIVE DRESSING) 2"X3" PADS   Does not apply   by Does not apply route.         Marland Kitchen guaifenesin (ROBITUSSIN) 100 MG/5ML syrup   Oral   Take 200 mg by mouth 3 (three) times daily as needed for cough.         Marland Kitchen ibuprofen (ADVIL,MOTRIN) 400 MG tablet   Oral   Take 400 mg by mouth every 6 (six) hours as needed.         Marland Kitchen levothyroxine (SYNTHROID,  LEVOTHROID) 100 MCG tablet   Oral   Take 100 mcg by mouth daily before breakfast.         . loperamide (IMODIUM) 2 MG capsule   Oral   Take by mouth as needed for diarrhea or loose stools.         . magnesium hydroxide (MILK OF MAGNESIA) 400 MG/5ML suspension   Oral   Take by mouth daily as needed for mild constipation.         . Multiple Vitamins-Minerals (MULTIVITAMIN WITH MINERALS) tablet   Oral   Take 1 tablet by mouth daily.         . nebivolol (BYSTOLIC) 5 MG tablet   Oral   Take 5 mg by mouth daily.         . polyethylene glycol (MIRALAX / GLYCOLAX) packet   Oral   Take 17 g by mouth daily.         . rivastigmine (EXELON) 1.5 MG capsule   Oral   Take 1.5 mg by mouth 2 (two) times daily.         . simvastatin (ZOCOR) 20 MG tablet   Oral   Take 20 mg by mouth daily.         . vitamin B-12 (CYANOCOBALAMIN) 1000 MCG tablet   Oral   Take 1,000 mcg by mouth daily.           Allergies Review of patient's allergies indicates no known allergies.  No family history on file.  Social History Social History  Substance Use Topics  . Smoking status: Former Games developer  . Smokeless tobacco: Not on file  . Alcohol Use: No    Review of  Systems  Constitutional: Negative for fever. Cardiovascular: Negative for chest pain. Respiratory: Negative for shortness of breath. Gastrointestinal: Negative for abdominal pain, vomiting and diarrhea. Genitourinary: Negative for dysuria. Musculoskeletal: Negative for back pain. Skin: Negative for rash. Neurological: Negative for headaches, focal weakness or numbness.  10-point ROS otherwise negative.  ____________________________________________   PHYSICAL EXAM:  VITAL SIGNS: ED Triage Vitals  Enc Vitals Group     BP --      Pulse --      Resp --      Temp 11/12/14 1509 98.2 F (36.8 C)     Temp Source 11/12/14 1509 Oral     SpO2 --      Weight --      Height --      Head Cir --      Peak Flow --      Pain Score --      Pain Loc --      Pain Edu? --      Excl. in GC? --     Constitutional: Awake, alert, no acute distress. Eyes: Conjunctivae are normal. PERRL. Normal extraocular movements. ENT   Head: Normocephalic. Very small laceration to the right upper lip, hemostatic.   Nose: No congestion/rhinnorhea.   Mouth/Throat: Mucous membranes are moist. Right front incisor chipped.   Neck: No stridor. Hematological/Lymphatic/Immunilogical: No cervical lymphadenopathy. Cardiovascular: Normal rate, regular rhythm.  No murmurs, rubs, or gallops. Respiratory: Normal respiratory effort without tachypnea nor retractions. Breath sounds are clear and equal bilaterally. No wheezes/rales/rhonchi. Gastrointestinal: Soft and nontender. No distention.  Genitourinary: Deferred Musculoskeletal: Normal range of motion in all extremities. No joint effusions.  No lower extremity tenderness nor edema. Neurologic:  Normal speech and language. No gross focal neurologic deficits are appreciated. Speech is normal.  Skin:  Skin  is warm, dry and intact. No rash noted. Psychiatric: Mood and affect are normal. Speech and behavior are normal. Patient exhibits appropriate insight and  judgment.  ____________________________________________    LABS (pertinent positives/negatives)  None  ____________________________________________   EKG  None  ____________________________________________    RADIOLOGY  CT head/cervical spine IMPRESSION: 1. No acute intracranial or calvarial findings. Stable atrophy and chronic small vessel ischemic changes. 2. No evidence of acute cervical spine fracture, traumatic subluxation or static signs of instability. 3. Stable multilevel cervical spondylosis with degenerative anterolisthesis at C3-4.  Chest x-ray  IMPRESSION: Chronic elevation of the right hemidiaphragm. No active disease. Thoracic spine osteopenia. ____________________________________________   PROCEDURES  Procedure(s) performed: None  Critical Care performed: No  ____________________________________________   INITIAL IMPRESSION / ASSESSMENT AND PLAN / ED COURSE  Pertinent labs & imaging results that were available during my care of the patient were reviewed by me and considered in my medical decision making (see chart for details).   patient presents to the emergency department today after mechanical fall. Patient suffered a small laceration to her upper lip. No need for any advance closure. CT and cervical spine with any acute disease. Additionally chest x-ray was obtained to evaluate for aspirated tooth fragment. Foreign body seen. Will plan on discharging back to the living facility.  ____________________________________________   FINAL CLINICAL IMPRESSION(S) / ED DIAGNOSES   fall  Laceration  Phineas Semen, MD 11/12/14 [phone]

## 2014-11-12 NOTE — ED Notes (Signed)
EMS here to transport patient to [facility].

## 2014-11-12 NOTE — Discharge Instructions (Signed)
Please seek medical attention for any high fevers, chest pain, shortness of breath, change in behavior, persistent vomiting, bloody stool or any other new or concerning symptoms. ° ° °Fall Prevention and Home Safety °Falls cause injuries and can affect all age groups. It is possible to use preventive measures to significantly decrease the likelihood of falls. There are many simple measures which can make your home safer and prevent falls. °OUTDOORS °· Repair cracks and edges of walkways and driveways. °· Remove high doorway thresholds. °· Trim shrubbery on the main path into your home. °· Have good outside lighting. °· Clear walkways of tools, rocks, debris, and clutter. °· Check that handrails are not broken and are securely fastened. Both sides of steps should have handrails. °· Have leaves, snow, and ice cleared regularly. °· Use sand or salt on walkways during winter months. °· In the garage, clean up grease or oil spills. °BATHROOM °· Install night lights. °· Install grab bars by the toilet and in the tub and shower. °· Use non-skid mats or decals in the tub or shower. °· Place a plastic non-slip stool in the shower to sit on, if needed. °· Keep floors dry and clean up all water on the floor immediately. °· Remove soap buildup in the tub or shower on a regular basis. °· Secure bath mats with non-slip, double-sided rug tape. °· Remove throw rugs and tripping hazards from the floors. °BEDROOMS °· Install night lights. °· Make sure a bedside light is easy to reach. °· Do not use oversized bedding. °· Keep a telephone by your bedside. °· Have a firm chair with side arms to use for getting dressed. °· Remove throw rugs and tripping hazards from the floor. °KITCHEN °· Keep handles on pots and pans turned toward the center of the stove. Use back burners when possible. °· Clean up spills quickly and allow time for drying. °· Avoid walking on wet floors. °· Avoid hot utensils and knives. °· Position shelves so they are  not too high or low. °· Place commonly used objects within easy reach. °· If necessary, use a sturdy step stool with a grab bar when reaching. °· Keep electrical cables out of the way. °· Do not use floor polish or wax that makes floors slippery. If you must use wax, use non-skid floor wax. °· Remove throw rugs and tripping hazards from the floor. °STAIRWAYS °· Never leave objects on stairs. °· Place handrails on both sides of stairways and use them. Fix any loose handrails. Make sure handrails on both sides of the stairways are as long as the stairs. °· Check carpeting to make sure it is firmly attached along stairs. Make repairs to worn or loose carpet promptly. °· Avoid placing throw rugs at the top or bottom of stairways, or properly secure the rug with carpet tape to prevent slippage. Get rid of throw rugs, if possible. °· Have an electrician put in a light switch at the top and bottom of the stairs. °OTHER FALL PREVENTION TIPS °· Wear low-heel or rubber-soled shoes that are supportive and fit well. Wear closed toe shoes. °· When using a stepladder, make sure it is fully opened and both spreaders are firmly locked. Do not climb a closed stepladder. °· Add color or contrast paint or tape to grab bars and handrails in your home. Place contrasting color strips on first and last steps. °· Learn and use mobility aids as needed. Install an electrical emergency response system. °· Turn on lights   to avoid dark areas. Replace light bulbs that burn out immediately. Get light switches that glow. °· Arrange furniture to create clear pathways. Keep furniture in the same place. °· Firmly attach carpet with non-skid or double-sided tape. °· Eliminate uneven floor surfaces. °· Select a carpet pattern that does not visually hide the edge of steps. °· Be aware of all pets. °OTHER HOME SAFETY TIPS °· Set the water temperature for 120° F (48.8° C). °· Keep emergency numbers on or near the telephone. °· Keep smoke detectors on  every level of the home and near sleeping areas. °Document Released: 01/31/2002 Document Revised: 08/12/2011 Document Reviewed: 05/02/2011 °[employer]® Patient Information ©2015 [employer]. This information is not intended to replace advice given to you by your health care provider. Make sure you discuss any questions you have with your health care provider. ° °

## 2014-12-20 ENCOUNTER — Emergency Department
Admission: EM | Admit: 2014-12-20 | Discharge: 2014-12-20 | Disposition: A | Discharge status: Home / Self Care | Payer: Medicare Other | Attending: Emergency Medicine | Admitting: Emergency Medicine

## 2014-12-20 ENCOUNTER — Emergency Department: Payer: Medicare Other

## 2014-12-20 ENCOUNTER — Encounter: Payer: Self-pay | Admitting: *Deleted

## 2014-12-20 DIAGNOSIS — Y998 Other external cause status: Secondary | ICD-10-CM | POA: Diagnosis not present

## 2014-12-20 DIAGNOSIS — Z87891 Personal history of nicotine dependence: Secondary | ICD-10-CM | POA: Diagnosis not present

## 2014-12-20 DIAGNOSIS — Y9389 Activity, other specified: Secondary | ICD-10-CM | POA: Insufficient documentation

## 2014-12-20 DIAGNOSIS — Z7982 Long term (current) use of aspirin: Secondary | ICD-10-CM | POA: Diagnosis not present

## 2014-12-20 DIAGNOSIS — Z79899 Other long term (current) drug therapy: Secondary | ICD-10-CM | POA: Insufficient documentation

## 2014-12-20 DIAGNOSIS — Y9289 Other specified places as the place of occurrence of the external cause: Secondary | ICD-10-CM | POA: Insufficient documentation

## 2014-12-20 DIAGNOSIS — W1839XA Other fall on same level, initial encounter: Secondary | ICD-10-CM | POA: Insufficient documentation

## 2014-12-20 DIAGNOSIS — F0390 Unspecified dementia without behavioral disturbance: Secondary | ICD-10-CM | POA: Diagnosis not present

## 2014-12-20 DIAGNOSIS — S0990XA Unspecified injury of head, initial encounter: Secondary | ICD-10-CM | POA: Insufficient documentation

## 2014-12-20 DIAGNOSIS — W19XXXA Unspecified fall, initial encounter: Secondary | ICD-10-CM

## 2014-12-20 NOTE — ED Notes (Signed)
Pt is from [facility], pt fell , lost balance hit head against door frame, pt has dementia, small hematoma noted to back of head

## 2014-12-20 NOTE — Discharge Instructions (Signed)
You have been seen in the emergency department after a fall. Your CT spine of your neck shows a possible small fracture, however this is a very stable fracture, and will likely not require any further treatment. Please follow-up with [facility] by calling the number below to arrange a follow-up appointment as soon as possible.  [phone]  Please return to the emergency department for any headache, weakness or numbness of any arm or leg, slurred speech, confusion, or any other symptom personally concerning to yourself.

## 2014-12-20 NOTE — ED Notes (Signed)
RN contacted [facility] and spoke to TolletteBrandy in regards to pt being transported home. [facility] reports not having transportation at this time and when asked about family for the pt that could be contacted they reported there was no one available to call. Pt will be sent home via EMS.

## 2014-12-20 NOTE — ED Provider Notes (Signed)
[facility] Emergency Department Provider Note  Time seen: 7:43 PM  I have reviewed the triage vital signs and the nursing notes.   HISTORY  Chief Complaint Fall    HPI Carrie Espinoza is a 81 y.o. female with a past medical history of falls, arthritis, osteoporosis, dementia, presents the emergency department after a fall. According to reportpatient lost her balance and fell forward hitting her head against a door frame. No loss of consciousness reported. Patient has significant dementia, coming from her nursing facility, does not recall falling, denies any medical complaints or pain.     Past Medical History  Diagnosis Date  . Fall with no significant injury   . Hypothyroid   . UTI (lower urinary tract infection)   . Osteoarthritis   . Osteoporosis   . Dementia   . Spinal stenosis   . Glaucoma   . Hyperlipemia     There are no active problems to display for this patient.   History reviewed. No pertinent past surgical history.  Current Outpatient Rx  Name  Route  Sig  Dispense  Refill  . acetaminophen (TYLENOL) 500 MG tablet   Oral   Take 500 mg by mouth every 6 (six) hours as needed.         Marland Kitchen alendronate (FOSAMAX) 70 MG tablet   Oral   Take 70 mg by mouth once a week. Take with a full glass of water on an empty stomach.         Marland Kitchen alum & mag hydroxide-simeth (MAALOX PLUS) 400-400-40 MG/5ML suspension   Oral   Take by mouth every 6 (six) hours as needed for indigestion.         Marland Kitchen amLODipine (NORVASC) 10 MG tablet   Oral   Take 10 mg by mouth daily.         Marland Kitchen aspirin 81 MG tablet   Oral   Take 81 mg by mouth daily.         . Calcium Carbonate-Vitamin D (CALTRATE 600+D PO)   Oral   Take by mouth.         . divalproex (DEPAKOTE SPRINKLE) 125 MG capsule   Oral   Take by mouth 2 (two) times daily.         Marland Kitchen docusate (COLACE) 50 MG/5ML liquid   Oral   Take by mouth daily.         . dorzolamide-timolol (COSOPT)  22.3-6.8 MG/ML ophthalmic solution      1 drop 2 (two) times daily.         . ferrous sulfate 325 (65 FE) MG tablet   Oral   Take 325 mg by mouth daily with breakfast.         . Gauze Pads & Dressings (TELFA ADHESIVE DRESSING) 2"X3" PADS   Does not apply   by Does not apply route.         Marland Kitchen guaifenesin (ROBITUSSIN) 100 MG/5ML syrup   Oral   Take 200 mg by mouth 3 (three) times daily as needed for cough.         Marland Kitchen ibuprofen (ADVIL,MOTRIN) 400 MG tablet   Oral   Take 400 mg by mouth every 6 (six) hours as needed.         Marland Kitchen levothyroxine (SYNTHROID, LEVOTHROID) 100 MCG tablet   Oral   Take 100 mcg by mouth daily before breakfast.         . loperamide (IMODIUM) 2 MG capsule  Oral   Take by mouth as needed for diarrhea or loose stools.         . magnesium hydroxide (MILK OF MAGNESIA) 400 MG/5ML suspension   Oral   Take by mouth daily as needed for mild constipation.         . Multiple Vitamins-Minerals (MULTIVITAMIN WITH MINERALS) tablet   Oral   Take 1 tablet by mouth daily.         . nebivolol (BYSTOLIC) 5 MG tablet   Oral   Take 5 mg by mouth daily.         . polyethylene glycol (MIRALAX / GLYCOLAX) packet   Oral   Take 17 g by mouth daily.         . rivastigmine (EXELON) 1.5 MG capsule   Oral   Take 1.5 mg by mouth 2 (two) times daily.         . simvastatin (ZOCOR) 20 MG tablet   Oral   Take 20 mg by mouth daily.         . vitamin B-12 (CYANOCOBALAMIN) 1000 MCG tablet   Oral   Take 1,000 mcg by mouth daily.           Allergies Review of patient's allergies indicates no known allergies.  No family history on file.  Social History Social History  Substance Use Topics  . Smoking status: Former Games developer  . Smokeless tobacco: None  . Alcohol Use: No    Review of Systems Constitutional: Negative for fever Cardiovascular: Negative for chest pain. Respiratory: Negative for shortness of breath. Gastrointestinal: Negative for  abdominal pain Musculoskeletal: Negative for back pain. No neck pain. 10-point ROS otherwise negative, however likely limited by the patient's history of dementia.  ____________________________________________   PHYSICAL EXAM:  VITAL SIGNS: ED Triage Vitals  Enc Vitals Group     BP 12/20/14 1853 122/73 mmHg     Pulse Rate 12/20/14 1853 68     Resp 12/20/14 1853 18     Temp 12/20/14 1853 97.8 F (36.6 C)     Temp Source 12/20/14 1853 Oral     SpO2 12/20/14 1853 97 %     Weight 12/20/14 1853 126 lb 1.7 oz (57.2 kg)     Height 12/20/14 1853 5' (1.524 m)     Head Cir --      Peak Flow --      Pain Score --      Pain Loc --      Pain Edu? --      Excl. in GC? --    Constitutional: Alert, well appearing, no distress. Eyes: Normal exam ENT   Head: Normocephalic and atraumatic. No hematoma palpated. No cervical spine tenderness on exam  Cardiovascular: Normal rate, regular rhythm. No murmur Respiratory: Normal respiratory effort without tachypnea nor retractions. Breath sounds are clear  Gastrointestinal: Soft and nontender. No distention.   Musculoskeletal: Nontender with normal range of motion in all extremities. No lower extremity tenderness or edema.. Pelvis. Good range of motion in upper and lower extremities including hips, no pain or tenderness elicited on exam. No C-spine or back tenderness. Neurologic:  Normal speech and language. No gross focal neurologic deficits Skin:  Skin is warm, dry and intact.  Psychiatric: Mood and affect are normal. Speech and behavior are normal.  ____________________________________________   RADIOLOGY  No intracranial abnormality. Possible C6 transverse process fracture. Discussed this fracture with radiology, does not appear to be an unstable fracture. As the patient has significant  dementia and no cervical tenderness on exam, I believe this could be safely followed up as an  outpatient.  ____________________________________________   INITIAL IMPRESSION / ASSESSMENT AND PLAN / ED COURSE  Pertinent labs & imaging results that were available during my care of the patient were reviewed by me and considered in my medical decision making (see chart for details).  Patient with significant dementia, atraumatic exam. We'll proceed with a CT head and C-spine to rule out intracranial abnormality or fracture. Overall the patient appears very well.  ____________________________________________   FINAL CLINICAL IMPRESSION(S) / ED DIAGNOSES  Thresa RossFall   Jahnia Hewes, MD 12/20/14 2043

## 2014-12-20 NOTE — ED Notes (Signed)
Patient transported to CT 

## 2014-12-20 NOTE — ED Notes (Signed)
Pt resting in front of nursing station. Pts eyes are closed but no acute distress noted.

## 2015-01-16 ENCOUNTER — Emergency Department
Admission: EM | Admit: 2015-01-16 | Discharge: 2015-01-16 | Disposition: A | Discharge status: Home / Self Care | Payer: Medicare Other | Attending: Emergency Medicine | Admitting: Emergency Medicine

## 2015-01-16 ENCOUNTER — Encounter: Payer: Self-pay | Admitting: Emergency Medicine

## 2015-01-16 DIAGNOSIS — Z043 Encounter for examination and observation following other accident: Secondary | ICD-10-CM | POA: Insufficient documentation

## 2015-01-16 DIAGNOSIS — Y92129 Unspecified place in nursing home as the place of occurrence of the external cause: Secondary | ICD-10-CM | POA: Insufficient documentation

## 2015-01-16 DIAGNOSIS — Z7982 Long term (current) use of aspirin: Secondary | ICD-10-CM | POA: Insufficient documentation

## 2015-01-16 DIAGNOSIS — R410 Disorientation, unspecified: Secondary | ICD-10-CM | POA: Diagnosis present

## 2015-01-16 DIAGNOSIS — E860 Dehydration: Secondary | ICD-10-CM | POA: Diagnosis not present

## 2015-01-16 DIAGNOSIS — W1839XA Other fall on same level, initial encounter: Secondary | ICD-10-CM | POA: Insufficient documentation

## 2015-01-16 DIAGNOSIS — Y9389 Activity, other specified: Secondary | ICD-10-CM | POA: Diagnosis not present

## 2015-01-16 DIAGNOSIS — W010XXA Fall on same level from slipping, tripping and stumbling without subsequent striking against object, initial encounter: Secondary | ICD-10-CM | POA: Insufficient documentation

## 2015-01-16 DIAGNOSIS — Y998 Other external cause status: Secondary | ICD-10-CM | POA: Insufficient documentation

## 2015-01-16 DIAGNOSIS — Z79899 Other long term (current) drug therapy: Secondary | ICD-10-CM | POA: Diagnosis not present

## 2015-01-16 DIAGNOSIS — N390 Urinary tract infection, site not specified: Secondary | ICD-10-CM | POA: Diagnosis not present

## 2015-01-16 DIAGNOSIS — W19XXXA Unspecified fall, initial encounter: Secondary | ICD-10-CM

## 2015-01-16 DIAGNOSIS — R55 Syncope and collapse: Secondary | ICD-10-CM | POA: Insufficient documentation

## 2015-01-16 DIAGNOSIS — Z87891 Personal history of nicotine dependence: Secondary | ICD-10-CM | POA: Insufficient documentation

## 2015-01-16 DIAGNOSIS — F0390 Unspecified dementia without behavioral disturbance: Secondary | ICD-10-CM | POA: Diagnosis not present

## 2015-01-16 DIAGNOSIS — R319 Hematuria, unspecified: Secondary | ICD-10-CM

## 2015-01-16 LAB — COMPREHENSIVE METABOLIC PANEL
ALT: 13 U/L — ABNORMAL LOW (ref 14–54)
AST: 30 U/L (ref 15–41)
Albumin: 3.2 g/dL — ABNORMAL LOW (ref 3.5–5.0)
Alkaline Phosphatase: 41 U/L (ref 38–126)
Anion gap: 6 (ref 5–15)
BUN: 40 mg/dL — ABNORMAL HIGH (ref 6–20)
CO2: 24 mmol/L (ref 22–32)
Calcium: 8.9 mg/dL (ref 8.9–10.3)
Chloride: 106 mmol/L (ref 101–111)
Creatinine, Ser: 1.42 mg/dL — ABNORMAL HIGH (ref 0.44–1.00)
GFR calc Af Amer: 35 mL/min — ABNORMAL LOW (ref >60)
GFR calc non Af Amer: 30 mL/min — ABNORMAL LOW (ref >60)
Glucose, Bld: 141 mg/dL — ABNORMAL HIGH (ref 65–99)
Potassium: 4.8 mmol/L (ref 3.5–5.1)
Sodium: 136 mmol/L (ref 135–145)
Total Bilirubin: 0.3 mg/dL (ref 0.3–1.2)
Total Protein: 6.2 g/dL — ABNORMAL LOW (ref 6.5–8.1)

## 2015-01-16 LAB — CBC WITH DIFFERENTIAL/PLATELET
Basophils Absolute: 0 10*3/uL (ref 0–0.1)
Basophils Relative: 0 %
Eosinophils Absolute: 0.1 10*3/uL (ref 0–0.7)
Eosinophils Relative: 1 %
HCT: 34.5 % — ABNORMAL LOW (ref 35.0–47.0)
Hemoglobin: 11.2 g/dL — ABNORMAL LOW (ref 12.0–16.0)
Lymphocytes Relative: 11 %
Lymphs Abs: 1.1 10*3/uL (ref 1.0–3.6)
MCH: 30.9 pg (ref 26.0–34.0)
MCHC: 32.5 g/dL (ref 32.0–36.0)
MCV: 95 fL (ref 80.0–100.0)
Monocytes Absolute: 1.1 10*3/uL — ABNORMAL HIGH (ref 0.2–0.9)
Monocytes Relative: 11 %
Neutro Abs: 7.9 10*3/uL — ABNORMAL HIGH (ref 1.4–6.5)
Neutrophils Relative %: 77 %
Platelets: 218 10*3/uL (ref 150–440)
RBC: 3.63 MIL/uL — ABNORMAL LOW (ref 3.80–5.20)
RDW: 14.9 % — ABNORMAL HIGH (ref 11.5–14.5)
WBC: 10.3 10*3/uL (ref 3.6–11.0)

## 2015-01-16 LAB — URINALYSIS COMPLETE WITH MICROSCOPIC (ARMC ONLY)
BILIRUBIN URINE: NEGATIVE
Glucose, UA: NEGATIVE mg/dL
Hgb urine dipstick: NEGATIVE
Ketones, ur: NEGATIVE mg/dL
Nitrite: POSITIVE — AB
Protein, ur: NEGATIVE mg/dL
Specific Gravity, Urine: 1.019 (ref 1.005–1.030)
Squamous Epithelial / LPF: NONE SEEN
pH: 6 (ref 5.0–8.0)

## 2015-01-16 LAB — TROPONIN I: Troponin I: <0.03 ng/mL (ref <0.031)

## 2015-01-16 LAB — VALPROIC ACID LEVEL: Valproic Acid Lvl: 20 ug/mL — ABNORMAL LOW (ref 50.0–100.0)

## 2015-01-16 MED ORDER — LEVOFLOXACIN IN D5W 750 MG/150ML IV SOLN
750.0000 mg | Freq: Once | INTRAVENOUS | Status: AC
  Administered 2015-01-16: 750 mg via INTRAVENOUS
  Filled 2015-01-16: qty 150

## 2015-01-16 MED ORDER — SODIUM CHLORIDE 0.9 % IV SOLN
Freq: Once | INTRAVENOUS | Status: AC
  Administered 2015-01-16: 20:00:00 via INTRAVENOUS

## 2015-01-16 NOTE — ED Notes (Signed)
Pt unable to ask screening questions secondary to dementia.

## 2015-01-16 NOTE — ED Notes (Addendum)
Pt via EMS from [facility], was seen here earlier for sycopal episodes also. Pt became weak in [facility] and was helped to a chair per EMS. Pt has hx of uti and dementia. Pt alert not oriented. Pt in no distress at this time. Pt has black bowel movement on , pt cleaned and changed at this time.

## 2015-01-16 NOTE — ED Notes (Signed)
Pt awaiting transport back to [facility]

## 2015-01-16 NOTE — ED Provider Notes (Signed)
[facility] Emergency Department Provider Note     Time seen: ----------------------------------------- 1:46 PM on 01/16/2015 -----------------------------------------    I have reviewed the triage vital signs and the nursing notes. L5 caveat: Review of systems and history is limited by severe dementia  HISTORY  Chief Complaint Fall    HPI Jolinda CroakMargaret D Petraitis is a 81 y.o. female who presents ER after an unwitnessed fall at the nursing home. Patient was found awake and alert lying on the floor was actually unsure if she fell or not. She does have severe dementia and is confused and combative with EMS and hospital staff on arrival.   Past Medical History  Diagnosis Date  . Fall with no significant injury   . Hypothyroid   . UTI (lower urinary tract infection)   . Osteoarthritis   . Osteoporosis   . Dementia   . Spinal stenosis   . Glaucoma   . Hyperlipemia     There are no active problems to display for this patient.   History reviewed. No pertinent past surgical history.  Allergies Review of patient's allergies indicates no known allergies.  Social History Social History  Substance Use Topics  . Smoking status: Former Games developermoker  . Smokeless tobacco: None  . Alcohol Use: No    Review of Systems Review of systems is unknown  ____________________________________________   PHYSICAL EXAM:  VITAL SIGNS: ED Triage Vitals  Enc Vitals Group     BP 01/16/15 1342 127/106 mmHg     Pulse Rate 01/16/15 1342 53     Resp 01/16/15 1342 18     Temp --      Temp src --      SpO2 01/16/15 1342 97 %     Weight --      Height --      Head Cir --      Peak Flow --      Pain Score --      Pain Loc --      Pain Edu? --      Excl. in GC? --     Constitutional: Alert but disoriented. No acute distress. Eyes: Conjunctivae are normal.  ENT   Head: Normocephalic and atraumatic. No contusions or abrasions are noted.   Nose: No  congestion/rhinnorhea.   Mouth/Throat: Mucous membranes are moist. Teeth are stained black from liquid iron   Neck: No stridor. Cardiovascular: Normal rate, regular rhythm. Normal and symmetric distal pulses are present in all extremities. No murmurs, rubs, or gallops. Respiratory: Normal respiratory effort without tachypnea nor retractions. Breath sounds are clear and equal bilaterally.  Gastrointestinal: Soft and nontender. Musculoskeletal: Nontender with normal range of motion in all extremities.  Neurologic:  No gross focal neurologic deficits are appreciated. Skin:  Skin is warm, dry and intact.  ____________________________________________  ED COURSE:  Pertinent labs & imaging results that were available during my care of the patient were reviewed by me and considered in my medical decision making (see chart for details). Patient is in no acute distress, very combative but without signs of injury.  ____________________________________________  FINAL ASSESSMENT AND PLAN  Unwitnessed fall, severe dementia  Plan: Patient with labs and imaging as dictated above. Patient looks well, no signs of any injury from a potential fall. She stable for outpatient follow-up   Emily FilbertWilliams, Jonathan E, MD   Emily FilbertJonathan E Williams, MD 01/16/15 1356

## 2015-01-16 NOTE — ED Provider Notes (Signed)
[facility] Emergency Department Provider Note  ____________________________________________  Time seen: Approximately 6:21 PM  I have reviewed the triage vital signs and the nursing notes.   HISTORY  Chief Complaint Near Syncope   history limited by severe dementia  HPI Carrie Espinoza is a 81 y.o. female patient was found on the floor earlier today brought to the emergency room was alert and very combative but no obvious injuries. She was discharged back to nursing home. This afternoon she was again walking and slipped slowly to the floor. There appears to be no injury. She is somewhat combative again per EMS EMS reports the nursing home says that when she gets UTIs she gets much more combative. Patient is also somewhat pale we will go ahead and check some lab work urine etc. since this is her second fall today. I am unable to get any other history due to her severe dementia.   Past Medical History  Diagnosis Date  . Fall with no significant injury   . Hypothyroid   . UTI (lower urinary tract infection)   . Osteoarthritis   . Osteoporosis   . Dementia   . Spinal stenosis   . Glaucoma   . Hyperlipemia     There are no active problems to display for this patient.   History reviewed. No pertinent past surgical history.  Current Outpatient Rx  Name  Route  Sig  Dispense  Refill  . acetaminophen (TYLENOL) 500 MG tablet   Oral   Take 500 mg by mouth every 6 (six) hours as needed.         Marland Kitchen alendronate (FOSAMAX) 70 MG tablet   Oral   Take 70 mg by mouth once a week. Take with a full glass of water on an empty stomach.         Marland Kitchen alum & mag hydroxide-simeth (MAALOX PLUS) 400-400-40 MG/5ML suspension   Oral   Take by mouth every 6 (six) hours as needed for indigestion.         Marland Kitchen amLODipine (NORVASC) 10 MG tablet   Oral   Take 10 mg by mouth daily.         Marland Kitchen aspirin 81 MG tablet   Oral   Take 81 mg by mouth daily.         . Calcium  Carbonate-Vitamin D (CALTRATE 600+D PO)   Oral   Take by mouth.         . divalproex (DEPAKOTE SPRINKLE) 125 MG capsule   Oral   Take by mouth 2 (two) times daily.         Marland Kitchen docusate (COLACE) 50 MG/5ML liquid   Oral   Take by mouth daily.         . dorzolamide-timolol (COSOPT) 22.3-6.8 MG/ML ophthalmic solution      1 drop 2 (two) times daily.         . ferrous sulfate 325 (65 FE) MG tablet   Oral   Take 325 mg by mouth daily with breakfast.         . Gauze Pads & Dressings (TELFA ADHESIVE DRESSING) 2"X3" PADS   Does not apply   by Does not apply route.         Marland Kitchen guaifenesin (ROBITUSSIN) 100 MG/5ML syrup   Oral   Take 200 mg by mouth 3 (three) times daily as needed for cough.         Marland Kitchen ibuprofen (ADVIL,MOTRIN) 400 MG tablet   Oral  Take 400 mg by mouth every 6 (six) hours as needed.         Marland Kitchen. levothyroxine (SYNTHROID, LEVOTHROID) 100 MCG tablet   Oral   Take 100 mcg by mouth daily before breakfast.         . loperamide (IMODIUM) 2 MG capsule   Oral   Take by mouth as needed for diarrhea or loose stools.         . magnesium hydroxide (MILK OF MAGNESIA) 400 MG/5ML suspension   Oral   Take by mouth daily as needed for mild constipation.         . Multiple Vitamins-Minerals (MULTIVITAMIN WITH MINERALS) tablet   Oral   Take 1 tablet by mouth daily.         . nebivolol (BYSTOLIC) 5 MG tablet   Oral   Take 5 mg by mouth daily.         . polyethylene glycol (MIRALAX / GLYCOLAX) packet   Oral   Take 17 g by mouth daily.         . rivastigmine (EXELON) 1.5 MG capsule   Oral   Take 1.5 mg by mouth 2 (two) times daily.         . simvastatin (ZOCOR) 20 MG tablet   Oral   Take 20 mg by mouth daily.         . vitamin B-12 (CYANOCOBALAMIN) 1000 MCG tablet   Oral   Take 1,000 mcg by mouth daily.           Allergies Review of patient's allergies indicates no known allergies.  No family history on file.  Social History Social  History  Substance Use Topics  . Smoking status: Former Games developermoker  . Smokeless tobacco: None  . Alcohol Use: No    Review of Systems Review of systems unavailable due to severe dementia  ____________________________________________   PHYSICAL EXAM:  VITAL SIGNS: ED Triage Vitals  Enc Vitals Group     BP 01/16/15 1819 114/61 mmHg     Pulse Rate 01/16/15 1819 63     Resp 01/16/15 1819 14     Temp --      Temp Source 01/16/15 1819 Oral     SpO2 01/16/15 1819 100 %     Weight --      Height --      Head Cir --      Peak Flow --      Pain Score --      Pain Loc --      Pain Edu? --      Excl. in GC? --     Constitutional: Alert  Well appearing and in no acute distress. Eyes: Conjunctivae are normal. PERRL. EOMI. Head: Atraumatic. Nose: No congestion/rhinnorhea. Mouth/Throat: Mucous membranes are moist.  Oropharynx non-erythematous. Neck: No stridor.  No cervical spine tenderness to palpation. Cardiovascular: Normal rate, regular rhythm. Grossly normal heart sounds.  Good peripheral circulation. Respiratory: Normal respiratory effort.  No retractions. Lungs CTAB the lung exam deferred as patient keeps on saying get away from me. Gastrointestinal: Soft and nontender. No distention. No abdominal bruits. Abdominal exam is difficult because patient is pushing my hands away. He does not appear to be tender however. }Musculoskeletal: No lower extremity tenderness nor edema.  No joint effusions. Neurologic:  Normal speech and language. No gross focal neurologic deficits are appreciated. Patient moving all extremities equally and well Skin:  Skin is warm, dry and intact. No rash noted.   ____________________________________________  LABS (all labs ordered are listed, but only abnormal results are displayed)  Labs Reviewed  COMPREHENSIVE METABOLIC PANEL - Abnormal; Notable for the following:    Glucose, Bld 141 (*)    BUN 40 (*)    Creatinine, Ser 1.42 (*)    Total Protein  6.2 (*)    Albumin 3.2 (*)    ALT 13 (*)    GFR calc non Af Amer 30 (*)    GFR calc Af Amer 35 (*)    All other components within normal limits  CBC WITH DIFFERENTIAL/PLATELET - Abnormal; Notable for the following:    RBC 3.63 (*)    Hemoglobin 11.2 (*)    HCT 34.5 (*)    RDW 14.9 (*)    Neutro Abs 7.9 (*)    Monocytes Absolute 1.1 (*)    All other components within normal limits  URINALYSIS COMPLETEWITH MICROSCOPIC ([facility] ONLY) - Abnormal; Notable for the following:    Color, Urine YELLOW (*)    APPearance CLOUDY (*)    Nitrite POSITIVE (*)    Leukocytes, UA 2+ (*)    Bacteria, UA RARE (*)    All other components within normal limits  VALPROIC ACID LEVEL - Abnormal; Notable for the following:    Valproic Acid Lvl 20 (*)    All other components within normal limits  TROPONIN I   ____________________________________________  EKG EKG read and interpreted by me shows normal sinus rhythm rate of 68 normal axis there is some inferior and lateral ST segment flattening in T-wave flattening as well is new from previous EKG within the last year  ____________________________________________  RADIOLOGY   ____________________________________________   PROCEDURES Troponin was negative. Patient is awake alert looking well does not appear to have any chest pain. Patient has a UTI. This will correspond with her previous history of combativeness combativeness with UTIs. We will treat her with Levaquin 750 every 48 hours hopefully this will simplify her treatment regime and make her more compliant. He also gave her some fluids as her BUN was elevated.  ____________________________________________   INITIAL IMPRESSION / ASSESSMENT AND PLAN / ED COURSE  Pertinent labs & imaging results that were available during my care of the patient were reviewed by me and considered in my medical decision making (see chart for details).   ____________________________________________   FINAL  CLINICAL IMPRESSION(S) / ED DIAGNOSES  Final diagnoses:  Urinary tract infection with hematuria, site unspecified  Dehydration      Arnaldo Natal, MD 01/16/15 2106

## 2015-01-16 NOTE — ED Notes (Signed)
Pt to ED via EMS from [facility]. EMS reports pt found awake & alert, lying on floor with arms out, unsure if pt fell or not. Pt has history of severe dementia, is confused and combative with EMS and hospital staff. Attending at bedside.

## 2015-01-16 NOTE — ED Notes (Signed)
Lanora ManisElizabeth, med tech from [facility] called to check on pt. RN updated on pt.

## 2015-01-16 NOTE — Discharge Instructions (Signed)

## 2015-01-16 NOTE — Discharge Instructions (Signed)
Antibiotic Medicine °Antibiotic medicines are used to treat infections caused by bacteria. They work by injuring or killing the bacteria that is making you sick. °HOW IS AN ANTIBIOTIC CHOSEN? °An antibiotic is chosen based on many factors. To help your health care provider choose one for you, tell your health care provider if: °· You have any allergies. °· You are pregnant or plan to get pregnant. °· You are breastfeeding. °· You are taking any medicines. These include over-the-counter medicines, prescription medicines, and herbal remedies. °· You have a medical condition or problem you have not already discussed. °Your health care provider will also consider: °· How often the medicine has to be taken. °· Common side effects of the medicine. °· The cost of the medicine. °· The taste of the medicine. °If you have questions about why an antibiotic was chosen, make sure to ask. °FOR HOW LONG SHOULD I TAKE MY ANTIBIOTIC? °Continue to take your antibiotic for as long as told by your health care provider. Do not stop taking it when you feel better. If you stop taking it too soon: °· You may start to feel sick again. °· Your infection may become harder to treat. °· Complications may develop. °WHAT IF I MISS A DOSE? °Try not to miss any doses of medicine. If you miss a dose, take it as soon as possible. However, if it is almost time for the next dose: °· If you are taking 2 doses per day, take the missed dose and the next dose 5 to 6 hours apart. °· If you are taking 3 or more doses per day, take the missed dose and the next dose 2 to 4 hours apart, then go back to the normal schedule. °If you cannot make up a missed dose, take the next scheduled dose on time. Then take the missed dose after you have taken all the doses as recommended by your health care provider, as if you had one more dose left. °DO ANTIBIOTICS AFFECT BIRTH CONTROL? °Birth control pills may not work while you are on antibiotics. If you are taking birth  control pills, continue taking them as usual and use a second form of birth control, such as a condom, to avoid unwanted pregnancy. Continue using the second form of birth control until you are finished with your current 1 month cycle of birth control pills. °OTHER INFORMATION °· If there is any medicine left over, throw it away. °· Never take someone else's antibiotics. °· Never take leftover antibiotics. °SEEK MEDICAL CARE IF: °· You get worse. °· You do not feel better within a few days of starting the antibiotic medicine. °· You vomit. °· White patches appear in your mouth. °· You have new joint pain that begins after starting the antibiotic. °· You have new muscle aches that begin after starting the antibiotic. °· You had a fever before starting the antibiotic and it returns. °· You have any symptoms of an allergic reaction, such as an itchy rash. If this happens, stop taking the antibiotic. °SEEK IMMEDIATE MEDICAL CARE IF: °· Your urine turns dark or becomes blood-colored. °· Your skin turns yellow. °· You bruise or bleed easily. °· You have severe diarrhea and abdominal cramps. °· You have a severe headache. °· You have signs of a severe allergic reaction, such as: °¨ Trouble breathing. °¨ Wheezing. °¨ Swelling of the lips, tongue, or face. °¨ Fainting. °¨ Blisters on the skin or in the mouth. °If you have signs of a severe allergic   reaction, stop taking the antibiotic right away.   This information is not intended to replace advice given to you by your health care provider. Make sure you discuss any questions you have with your health care provider.   Document Released: 10/24/2003 Document Revised: 11/01/2014 Document Reviewed: 06/28/2014 Elsevier Interactive Patient Education 2016 [employer]  Take the Levaquin pills one every other day. One dose was given in the ER today. The next dose should be given on the 24th of this month and then 1 more dose on the 26th of this month and that should be it.  Please return for any further worsening or any new problems or if she does not improve as she usually does with a UTI. She's been treated

## 2015-01-16 NOTE — ED Notes (Signed)
Pt has no c/o pain at this time. No obvious fall-related injury noted. Old skin tear present to RFA. Pt at baseline cognitive status.

## 2015-01-16 NOTE — ED Notes (Signed)
Pt sleeping at this time.

## 2015-01-16 NOTE — ED Notes (Signed)
Pt unable to sign discharge e-signature due to dementia and no family at bedside. Pt being transported via ems back to [facility]

## 2016-03-27 DEATH — deceased

## 2016-10-21 IMAGING — CT CT HEAD WITHOUT CONTRAST
4 of 5 series · 17 of 30 positions shown, 18 images · non-contrast
Comparison: CT of the head and cervical spine September 14, 2013 and CT
of the head February 23, 2014

CLINICAL DATA: Found down on floor at [HOSPITAL].  Dementia.

EXAM:
CT HEAD WITHOUT CONTRAST
CT CERVICAL SPINE WITHOUT CONTRAST
TECHNIQUE: Multidetector CT imaging of the head and cervical spine was
performed following the standard protocol without intravenous
contrast. Multiplanar CT image reconstructions of the cervical spine
were also generated.

[Series 2: head bone · axial · 0.42mm/px · z∈[-157,-35]mm · 7 of 83 slices shown]
[im 11/83  bone]
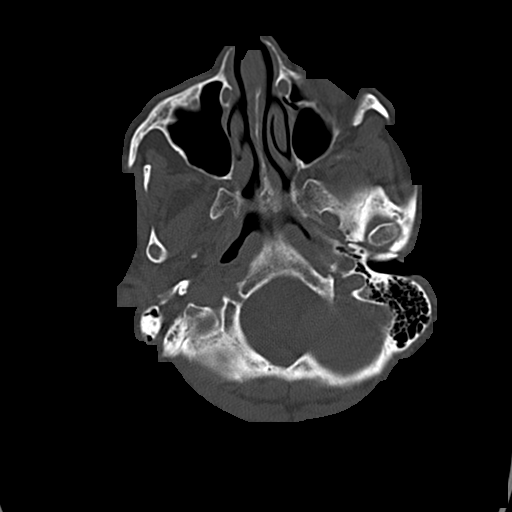
[im 21/83  bone]
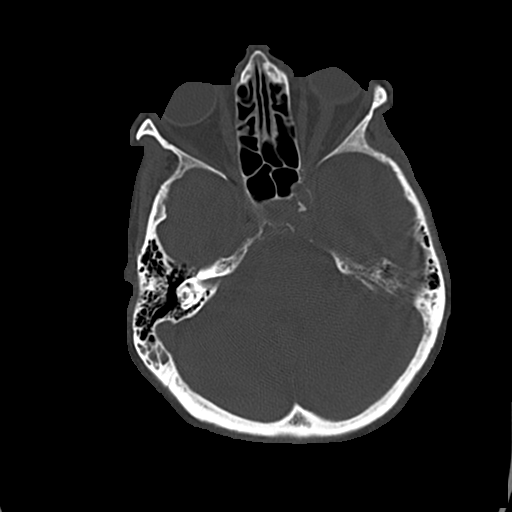
[im 31/83  bone]
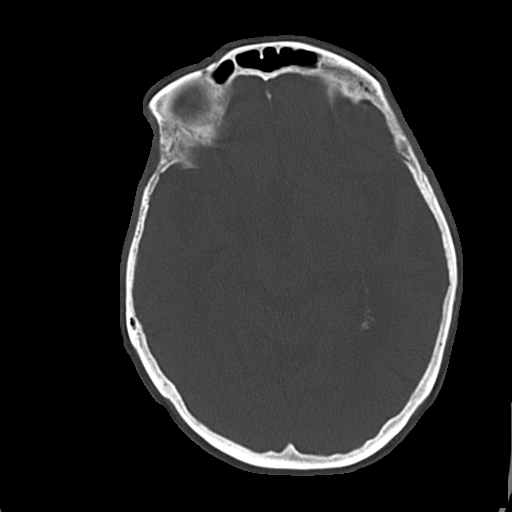
[im 42/83  bone]
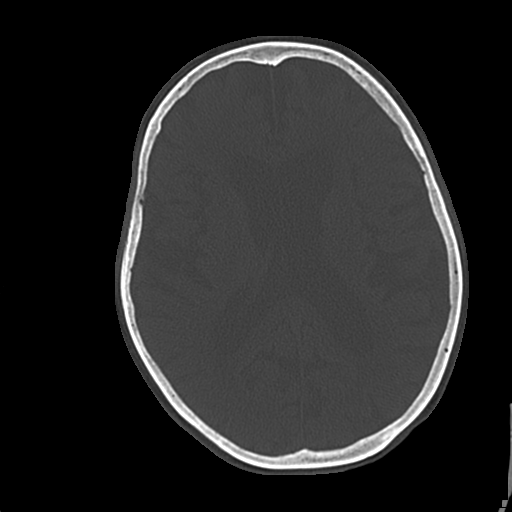
[im 52/83  bone]
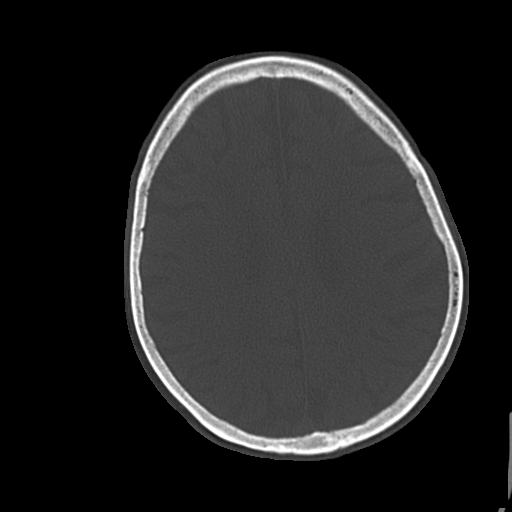
[im 62/83  bone]
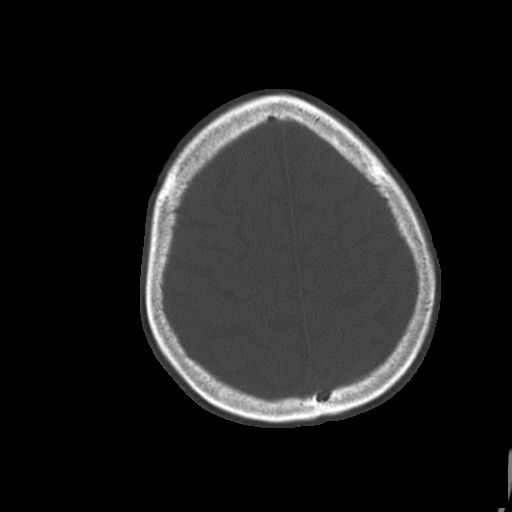
[im 72/83  bone]
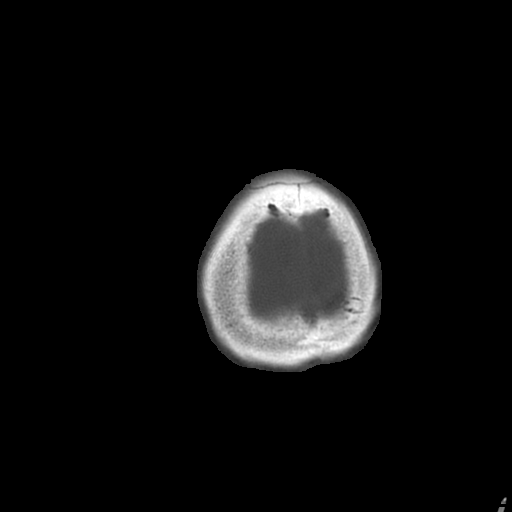

[Series 3: head wo · axial · 0.42mm/px · z∈[-168,-23]mm · 3 of 30 slices shown, 4 images]
[im 1/30  brain]
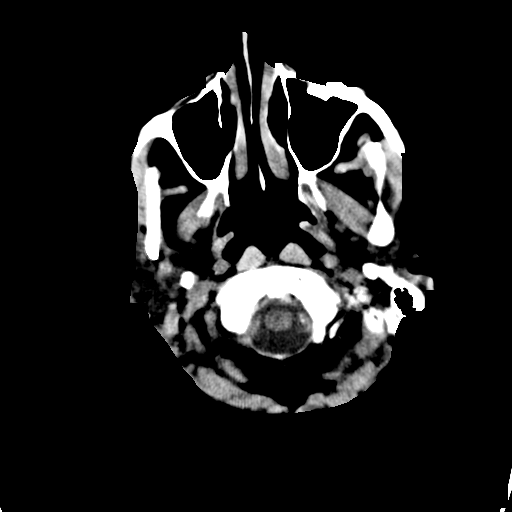
[im 1/30  bone]
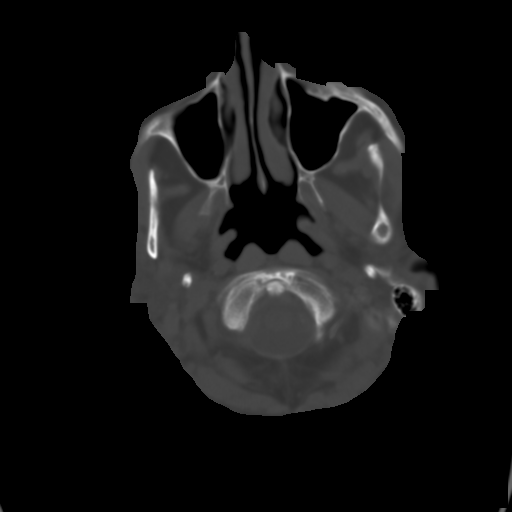
[im 15/30  brain]
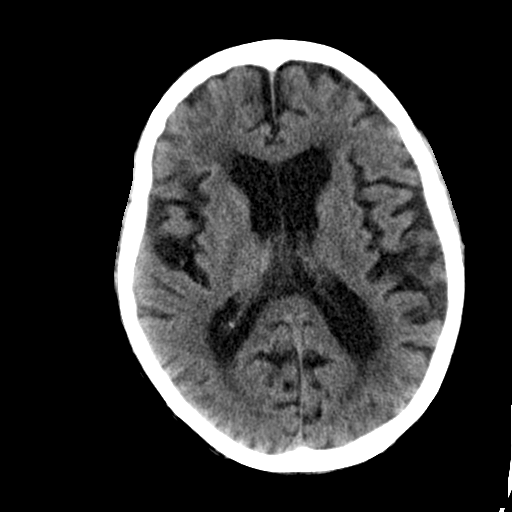
[im 30/30  brain]
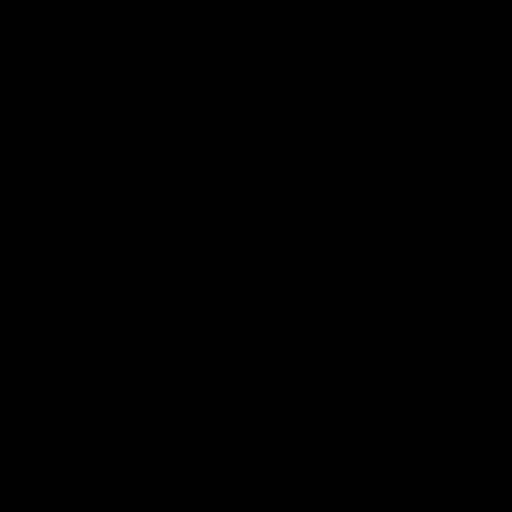

[Series 5: c spine soft · axial · 0.30mm/px · z∈[-264,-244]mm · 2 of 71 slices shown]
[im 11/71  brain]
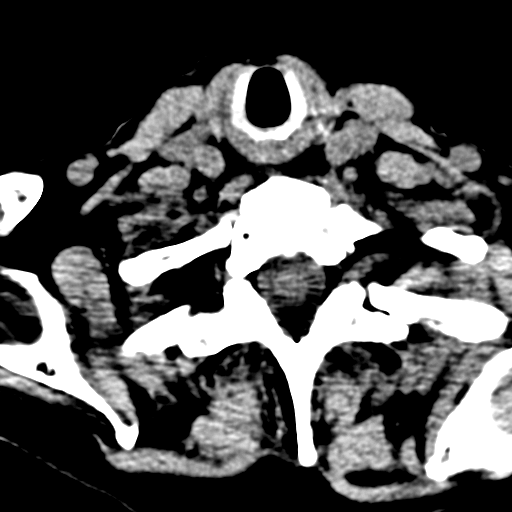
[im 21/71  brain]
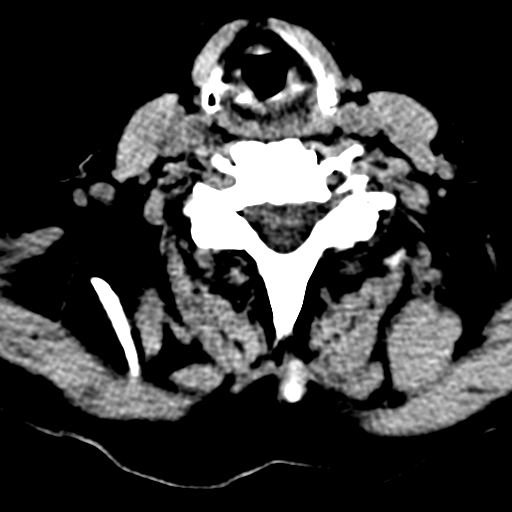

[Series 7: head bone recons · axial · 0.42mm/px · z∈[-86,-3]mm · 5 of 69 slices shown]
[im 12/69  bone]
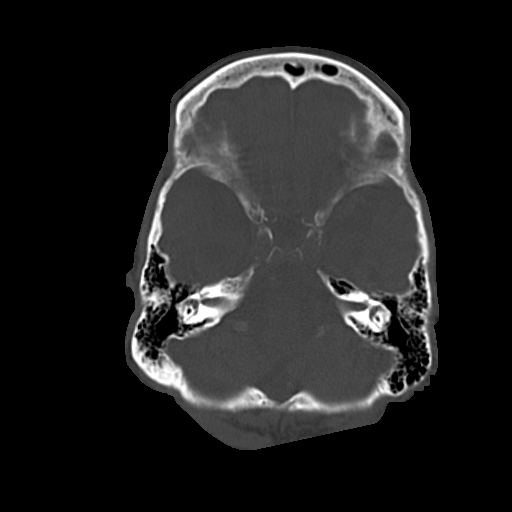
[im 23/69  bone]
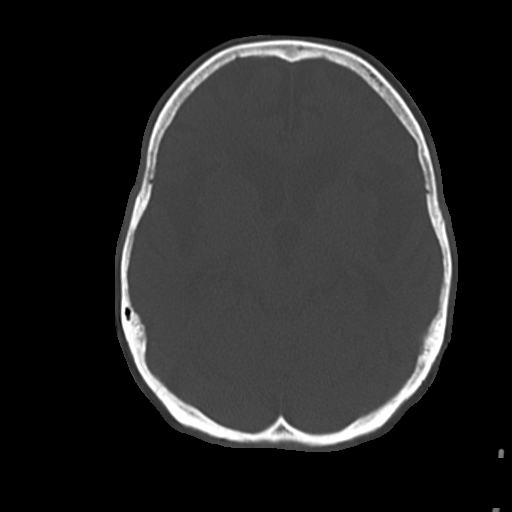
[im 35/69  bone]
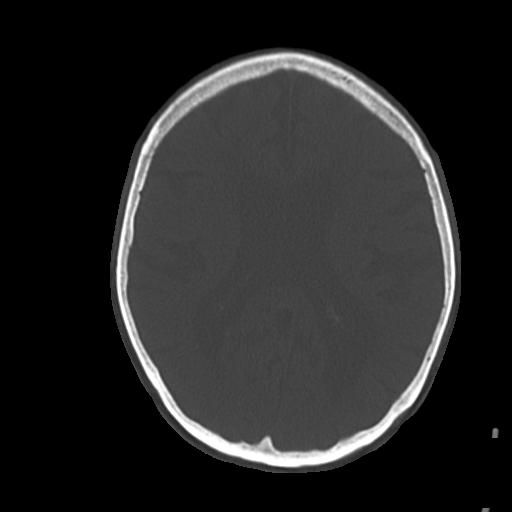
[im 46/69  bone]
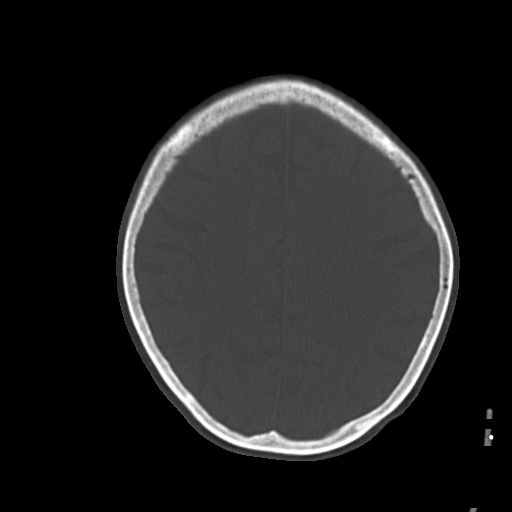
[im 57/69  bone]
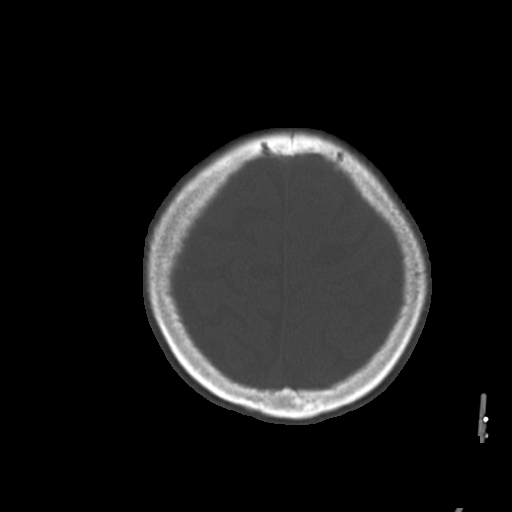

[17 of 30 positions shown; findings below may reference images not displayed]

FINDINGS: CT HEAD FINDINGS

The ventricles and sulci are normal for age. No intraparenchymal
hemorrhage, mass effect nor midline shift. Patchy to confluent
supratentorial white matter hypodensities are within normal range
for patient's age and though non-specific suggest sequelae of
chronic small vessel ischemic disease. No acute large vascular
territory infarcts.

No abnormal extra-axial fluid collections. Basal cisterns are
patent. Moderate calcific atherosclerosis of the carotid siphons.

No skull fracture. Remote mildly depressed RIGHT zygomatic arch
fracture. The included ocular globes and orbital contents are
non-suspicious. LEFT posterior ethmoid mucosal retention cyst
without paranasal sinus air-fluid levels. The mastoid air cells are
well aerated.

CT CERVICAL SPINE FINDINGS

Cervical vertebral bodies intact. Grade 1 C3-4 anterolisthesis and,
minimal grade 1 C7-T1 anterolisthesis without spondylolysis. Severe
C3-4 thru C6-7 disc height loss, endplate sclerosis and marginal
spurring consistent with degenerative discs. C1-2 articulate chin
maintained with moderate arthropathy. No destructive bony lesions.
Mild to moderate calcific atherosclerosis of the carotid bulbs.
IMPRESSION: CT HEAD: No acute intracranial process.

Stable involutional changes. Moderate to severe white matter changes
can be seen with chronic small vessel ischemic disease.

CT CERVICAL SPINE: No acute fracture.

Stable grade 1 C3-4 and minimal grade 1 C7-T1 anterolisthesis on
degenerative basis.

By: Kais Deyoung

## 2016-10-21 IMAGING — CR DG WRIST COMPLETE 3+V*R*
1 series · 4 of 4 positions shown · non-contrast
Comparison: None.

CLINICAL DATA: Pain with range of motion after fall.

EXAM:
RIGHT WRIST - COMPLETE 3+ VIEW

[Series 1: dxr wrist rt comp with obliques · 0.14mm/px · 4 of 4 slices shown]
[im 1/4]
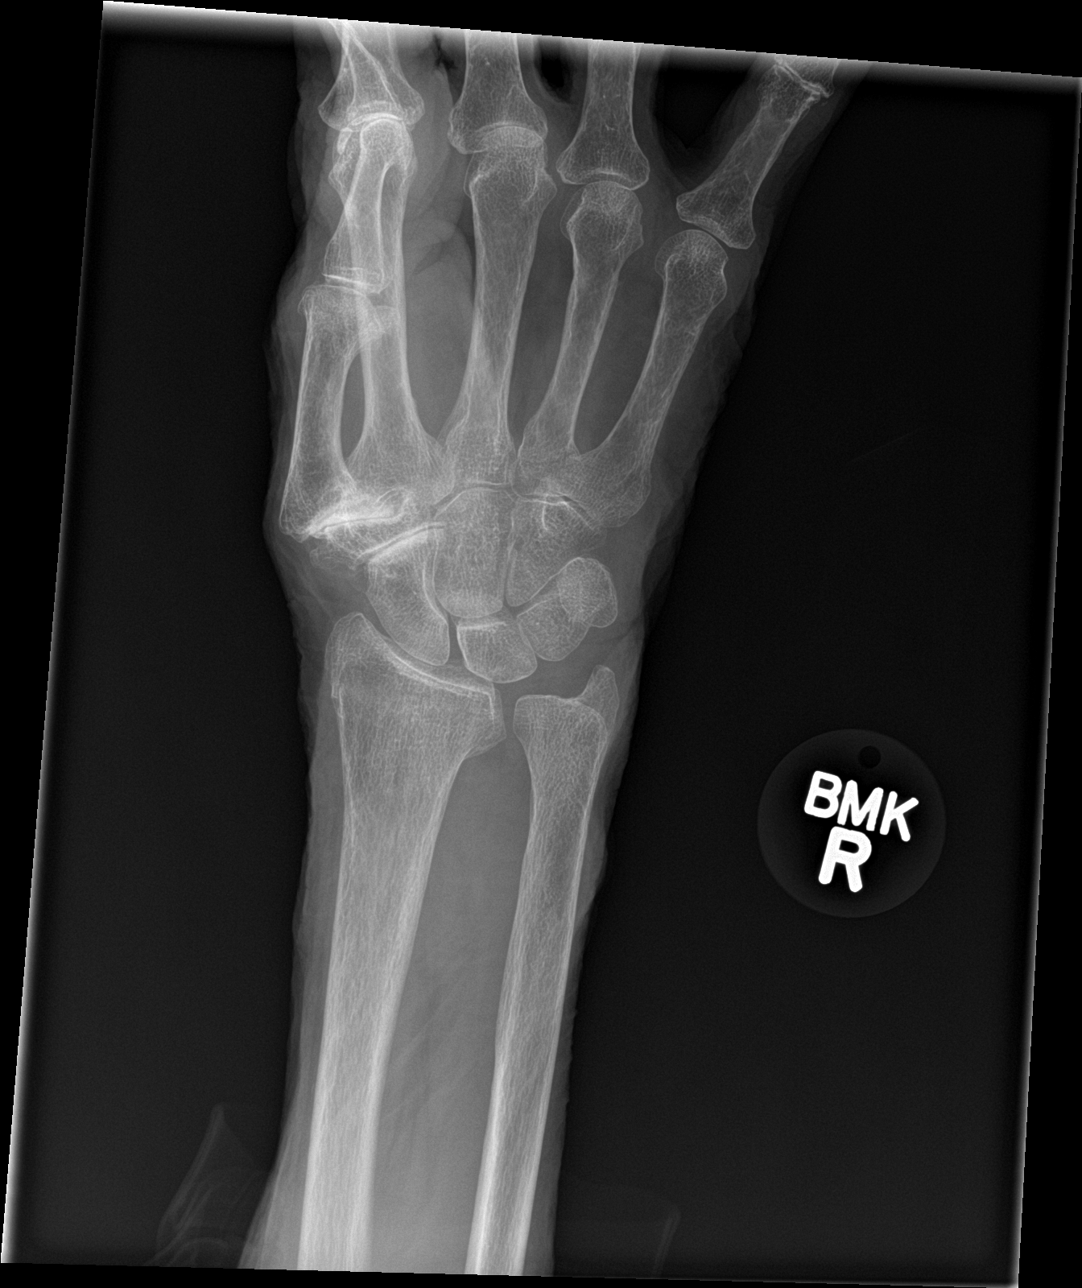
[im 2/4]
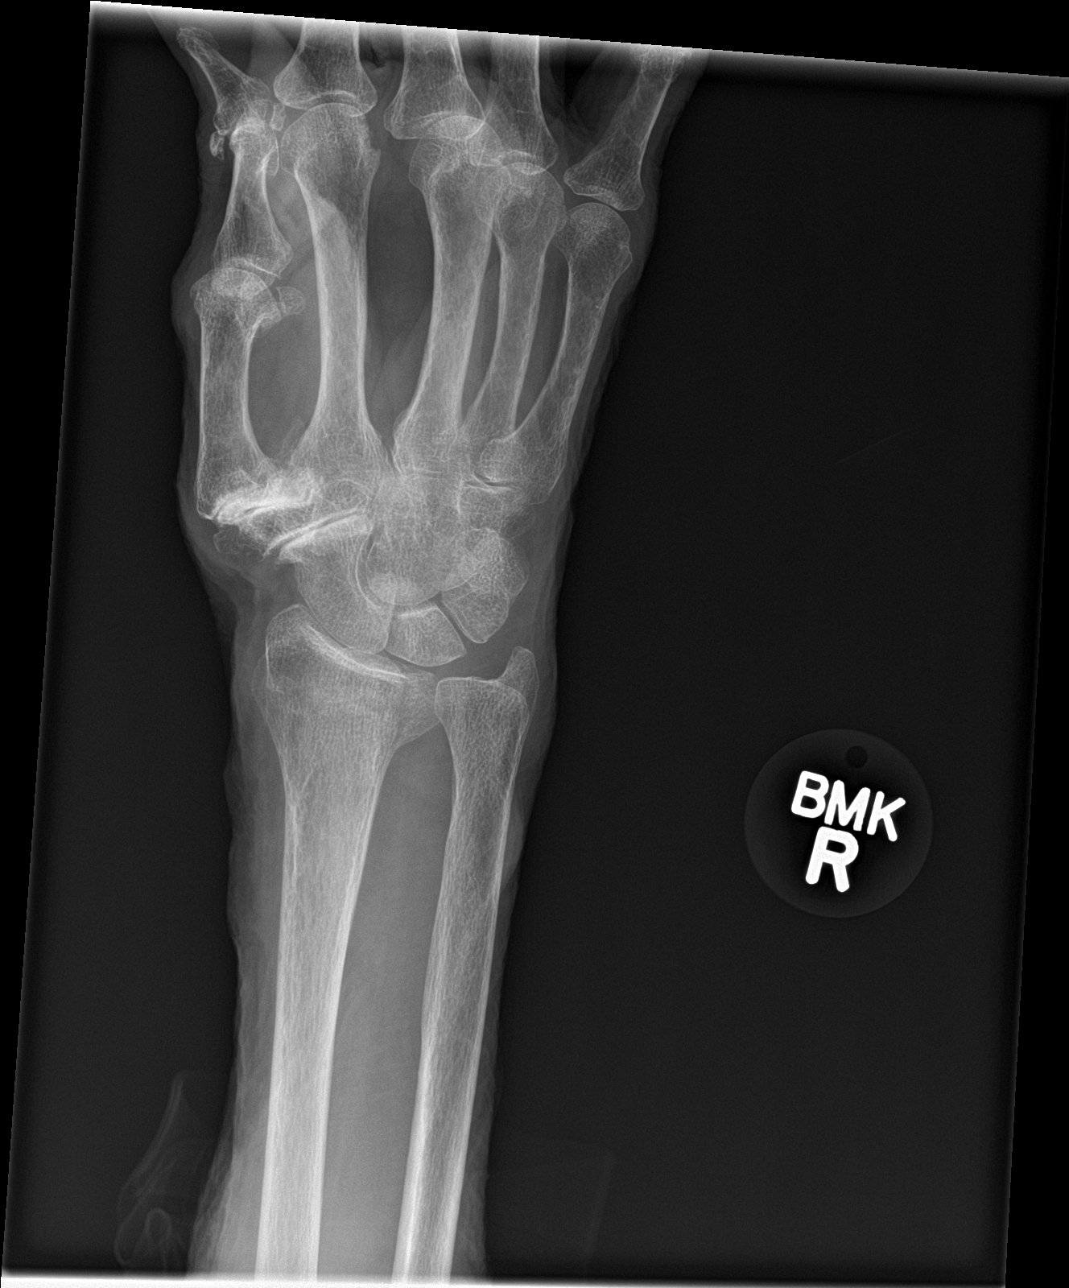
[im 3/4]
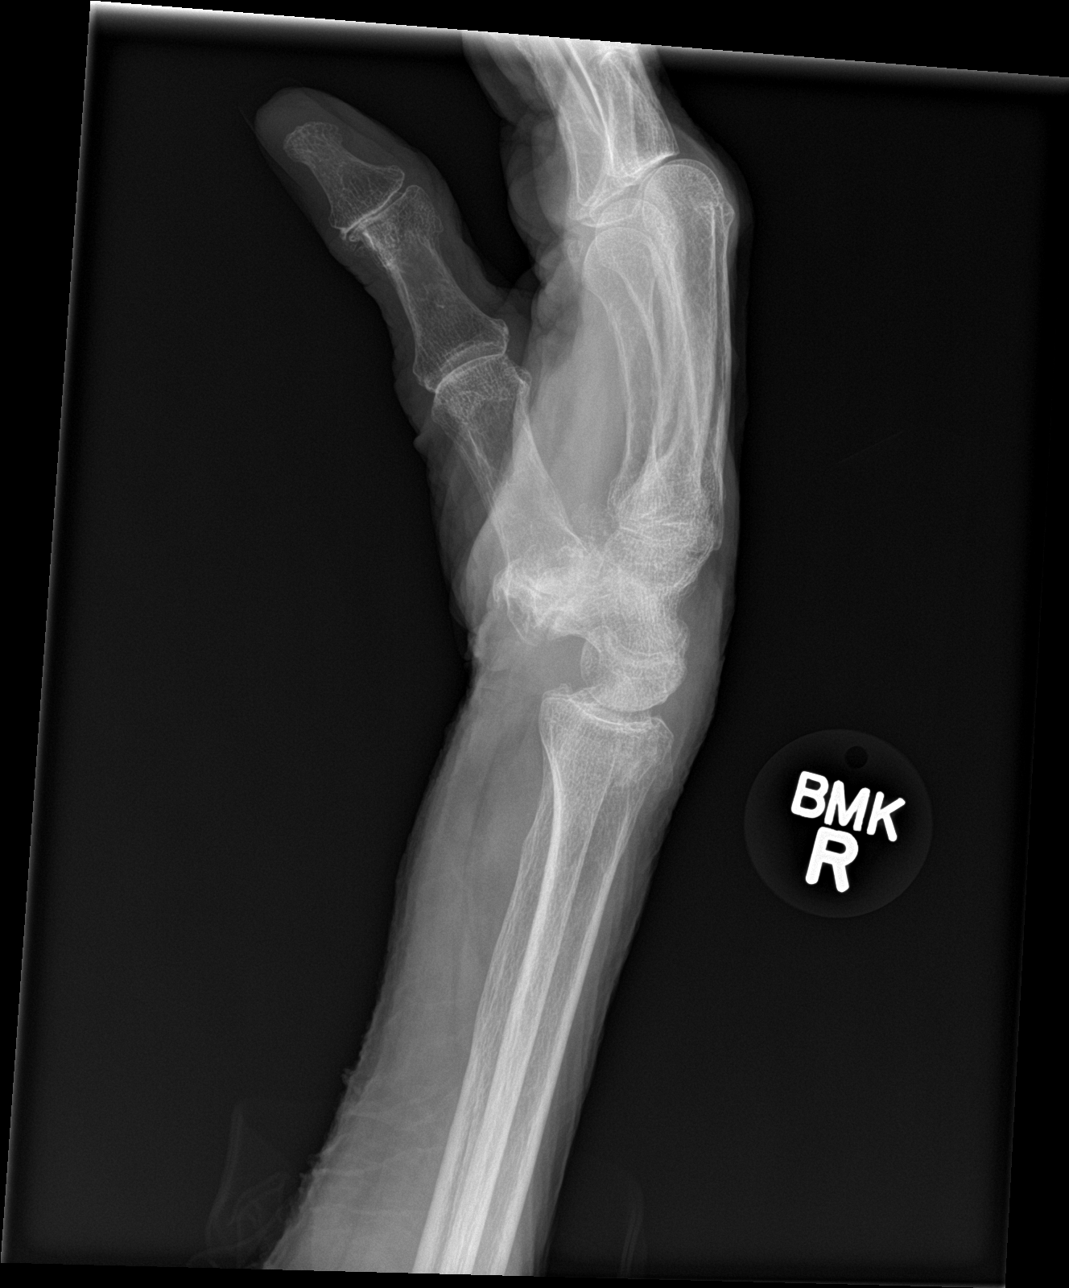
[im 4/4]
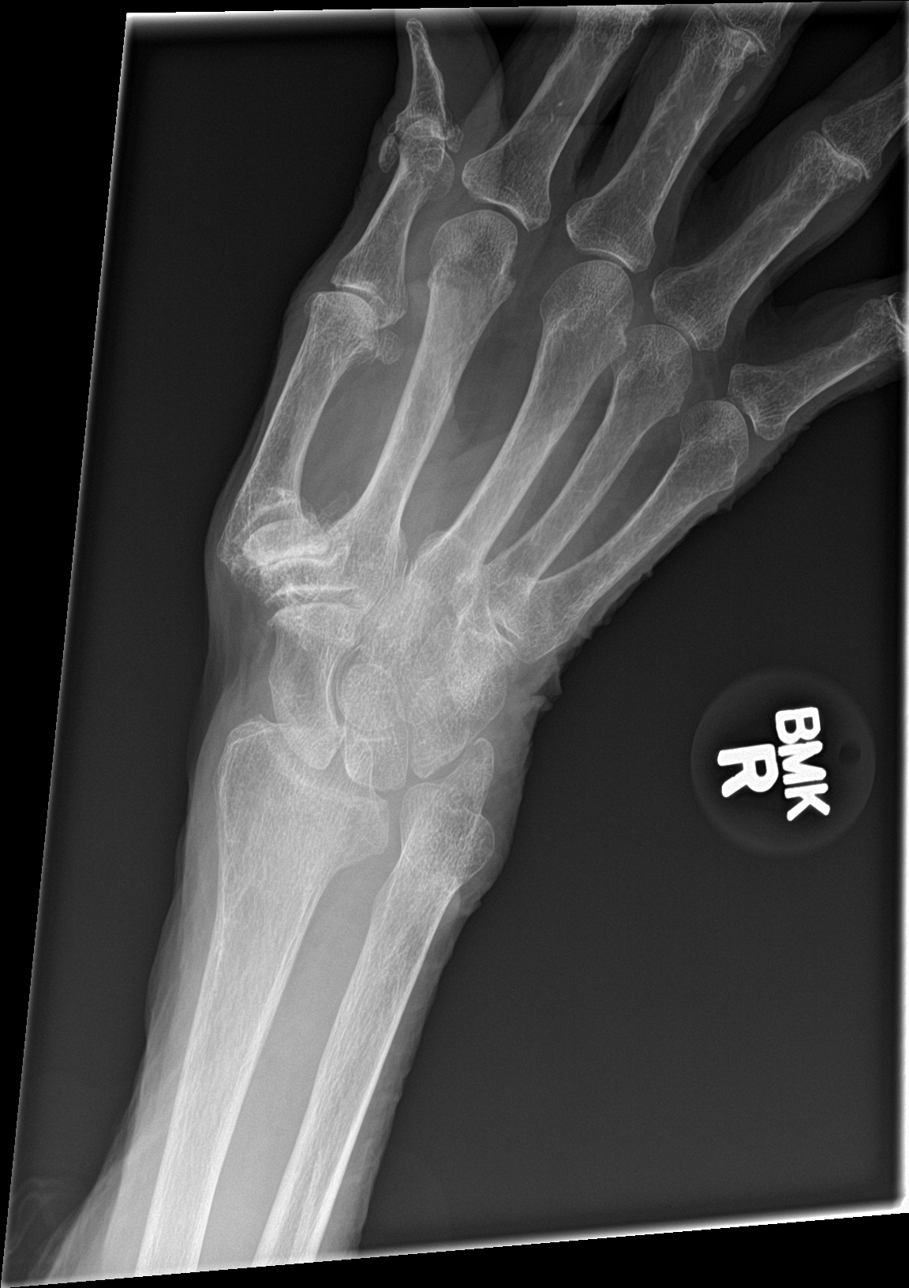

[4 of 4 positions shown; findings below may reference images not displayed]

FINDINGS: Cortical disruption of the dorsal radial metaphysis distally with
mild impaction. No articular surface extension or abnormal wrist
tilt.

Advanced first CMC and STT osteoarthritis.  Osteopenia.
IMPRESSION: Mildly impacted dorsal radial metaphysis fracture. Although fracture
margins are age-indeterminate, soft tissue swelling and history
suggests acuity.
